# Patient Record
Sex: Male | Born: 1985 | Race: White | Hispanic: No | Marital: Married | Smoking: Former smoker
Health system: Southern US, Community
[De-identification: ages and names within clinical notes are randomized; demographics above are authoritative.]

## PROBLEM LIST (undated history)

## (undated) DIAGNOSIS — R7989 Other specified abnormal findings of blood chemistry: Secondary | ICD-10-CM

## (undated) DIAGNOSIS — F32A Depression, unspecified: Secondary | ICD-10-CM

## (undated) DIAGNOSIS — F419 Anxiety disorder, unspecified: Secondary | ICD-10-CM

## (undated) DIAGNOSIS — F329 Major depressive disorder, single episode, unspecified: Secondary | ICD-10-CM

## (undated) DIAGNOSIS — I10 Essential (primary) hypertension: Secondary | ICD-10-CM

## (undated) HISTORY — DX: Essential (primary) hypertension: I10

## (undated) HISTORY — DX: Depression, unspecified: F32.A

## (undated) HISTORY — DX: Anxiety disorder, unspecified: F41.9

## (undated) HISTORY — DX: Other specified abnormal findings of blood chemistry: R79.89

## (undated) HISTORY — DX: Major depressive disorder, single episode, unspecified: F32.9

---

## 2006-03-17 ENCOUNTER — Emergency Department: Payer: Self-pay | Admitting: Emergency Medicine

## 2006-12-19 ENCOUNTER — Other Ambulatory Visit: Payer: Self-pay

## 2006-12-19 ENCOUNTER — Emergency Department: Payer: Self-pay | Admitting: Unknown Physician Specialty

## 2007-03-12 ENCOUNTER — Emergency Department: Payer: Self-pay | Admitting: Emergency Medicine

## 2009-05-01 ENCOUNTER — Emergency Department: Payer: Self-pay

## 2009-11-15 ENCOUNTER — Encounter: Admission: RE | Admit: 2009-11-15 | Discharge: 2009-11-15 | Payer: Self-pay | Admitting: Orthopedic Surgery

## 2010-06-08 HISTORY — PX: FOOT SURGERY: SHX648

## 2011-10-02 ENCOUNTER — Other Ambulatory Visit: Payer: Self-pay | Admitting: Physician Assistant

## 2012-06-05 ENCOUNTER — Other Ambulatory Visit: Payer: Self-pay | Admitting: Physician Assistant

## 2012-06-05 NOTE — Telephone Encounter (Signed)
Please pull chart.

## 2012-06-06 NOTE — Telephone Encounter (Signed)
Chart pulled to [place] [facility] [id number]

## 2012-08-02 ENCOUNTER — Telehealth: Payer: Self-pay

## 2012-08-02 NOTE — Telephone Encounter (Signed)
Pt is out of effexor and pharmacy has sent over request please call patient at [phone]

## 2012-08-02 NOTE — Telephone Encounter (Signed)
Patient needs office visit. Called him to advise.

## 2012-08-03 ENCOUNTER — Ambulatory Visit (INDEPENDENT_AMBULATORY_CARE_PROVIDER_SITE_OTHER): Payer: 59 | Admitting: Family Medicine

## 2012-08-03 VITALS — BP 108/75 | HR 77 | Temp 98.0°F | Resp 18 | Ht 73.0 in | Wt 223.8 lb

## 2012-08-03 DIAGNOSIS — F3162 Bipolar disorder, current episode mixed, moderate: Secondary | ICD-10-CM | POA: Insufficient documentation

## 2012-08-03 DIAGNOSIS — F319 Bipolar disorder, unspecified: Secondary | ICD-10-CM

## 2012-08-03 MED ORDER — VENLAFAXINE HCL ER 75 MG PO CP24: ORAL_CAPSULE | ORAL | Status: DC

## 2012-08-03 NOTE — Patient Instructions (Addendum)
Congratulations on your new daughter!  I hope that you get along with the doctor you are meeting.   Keep an eye on your symptoms and mood- if you are not able to sleep or feel that you are becoming less stable please let us or your psychiatrist know.

## 2012-08-03 NOTE — Progress Notes (Signed)
[facility] [address] [place] [address] [phone]  Date:  08/03/2012   Name:  Breyden Jeudy   DOB:  22-Jan-1986   MRN:  [id number]  PCP:  No primary provider on file.    Chief Complaint: Follow-up   History of Present Illness:  James Haynes is a 27 y.o. very pleasant male patient who presents with the following:  Last seen here in 01/2011.   History of bipolar disorder type 1. He has been on effexor for a long time- about 8 years. He was last hospitalized at age 49, and feels that he is doing ok at this time.  He has an appt to see a new psychiatrist in [place] next week- he has had a hard time finding a psychiatrist he gets along with.   He feels that his mood is ok and stable, and denies any SI.  He had a daughter born about 6 weeks ago which is making it harder to get enough sleep, but he feels he is handling this ok.     There is no problem list on file for this patient.   Past Medical History  Diagnosis Date  . Depression     History reviewed. No pertinent past surgical history.  History  Substance Use Topics  . Smoking status: Current Some Day Smoker  . Smokeless tobacco: Not on file  . Alcohol Use: Not on file    No family history on file.  No Known Allergies  Medication list has been reviewed and updated.  Current Outpatient Prescriptions on File Prior to Visit  Medication Sig Dispense Refill  . venlafaxine XR (EFFEXOR-XR) 75 MG 24 hr capsule TAKE 3 CAPSULES DAILY  270 capsule  2   No current facility-administered medications on file prior to visit.    Review of Systems:  As per HPI- otherwise negative.   Physical Examination: Filed Vitals:   08/03/12 1512  BP: 108/75  Pulse: 77  Temp: 98 F (36.7 C)  Resp: 18   Filed Vitals:   08/03/12 1512  Height: 6' 1"  (1.854 m)  Weight: 223 lb 12.8 oz (101.515 kg)   Body mass index is 29.53 kg/(m^2). Ideal Body Weight: Weight in (lb) to have BMI = 25: 189.1  GEN:  WDWN, NAD, Non-toxic, A & O x 3, overweight HEENT: Atraumatic, Normocephalic. Neck supple. No masses, No LAD. Ears and Nose: No external deformity. CV: RRR, No M/G/R. No JVD. No thrill. No extra heart sounds. PULM: CTA B, no wheezes, crackles, rhonchi. No retractions. No resp. distress. No accessory muscle use. EXTR: No c/c/e NEURO Normal gait.  PSYCH: Normally interactive. Conversant. Not depressed or anxious appearing.  Calm demeanor.    Assessment and Plan: Bipolar disorder, unspecified - Plan: venlafaxine XR (EFFEXOR-XR) 75 MG 24 hr capsule, venlafaxine XR (EFFEXOR-XR) 75 MG 24 hr capsule  Refilled his effexor- a one week supply at local pharm and a mail away rx.  He plans to see a psychiatrist soon and explained that they will likely take over prescribing his medication. Be alert to changes in mood and be sure to get adequate rest.  Seek help as needed  Meds ordered this encounter  Medications  . venlafaxine XR (EFFEXOR-XR) 75 MG 24 hr capsule    Sig: TAKE 3 CAPSULES DAILY    Dispense:  270 capsule    Refill:  3  . venlafaxine XR (EFFEXOR-XR) 75 MG 24 hr capsule    Sig: TAKE 3 CAPSULES DAILY  Dispense:  21 capsule    Refill:  0     Wylder Macomber, MD

## 2012-11-12 ENCOUNTER — Other Ambulatory Visit: Payer: Self-pay | Admitting: Family Medicine

## 2012-11-12 NOTE — Telephone Encounter (Signed)
Pt is checking on the status of his refill request

## 2012-11-14 ENCOUNTER — Telehealth: Payer: Self-pay | Admitting: Radiology

## 2012-11-14 DIAGNOSIS — F319 Bipolar disorder, unspecified: Secondary | ICD-10-CM

## 2012-11-14 MED ORDER — VENLAFAXINE HCL ER 75 MG PO CP24: ORAL_CAPSULE | ORAL | Status: DC

## 2012-11-14 NOTE — Telephone Encounter (Signed)
LMOM to CB. On 08/03/12 we sent in #270 w/3 RFs to [facility]. Does pt want this Rx from local pharm now?

## 2012-11-14 NOTE — Telephone Encounter (Signed)
Pamala Hurry, patient did call back, one month supply of his med was sent in for him until he can get the mail order. He is out.

## 2013-08-04 ENCOUNTER — Other Ambulatory Visit: Payer: Self-pay | Admitting: Family Medicine

## 2013-11-21 ENCOUNTER — Other Ambulatory Visit: Payer: Self-pay | Admitting: Physician Assistant

## 2013-12-27 ENCOUNTER — Other Ambulatory Visit: Payer: Self-pay | Admitting: Family Medicine

## 2013-12-27 ENCOUNTER — Telehealth: Payer: Self-pay

## 2013-12-27 NOTE — Telephone Encounter (Signed)
LMOM for pt that he will need to be seen before any more RFs can be sent. It has been almost 1 1/2 yrs since last OV. Advised we are open 7 days a week and will be happy to see him.

## 2013-12-27 NOTE — Telephone Encounter (Signed)
Pt called in requesting previous message medication to be sent in

## 2013-12-27 NOTE — Telephone Encounter (Signed)
Patient is requesting we send a three month supply of venlafaxine XR (EFFEXOR-XR) 75 MG 24 hr capsule [facility] [place]

## 2013-12-29 ENCOUNTER — Ambulatory Visit (INDEPENDENT_AMBULATORY_CARE_PROVIDER_SITE_OTHER): Payer: 59 | Admitting: Family Medicine

## 2013-12-29 VITALS — BP 128/74 | HR 78 | Temp 97.7°F | Resp 16 | Ht 73.0 in | Wt 215.6 lb

## 2013-12-29 DIAGNOSIS — Z23 Encounter for immunization: Secondary | ICD-10-CM

## 2013-12-29 DIAGNOSIS — F319 Bipolar disorder, unspecified: Secondary | ICD-10-CM

## 2013-12-29 MED ORDER — VENLAFAXINE HCL ER 75 MG PO CP24: ORAL_CAPSULE | ORAL | Status: DC

## 2013-12-29 NOTE — Patient Instructions (Signed)
We got the "tdap" shot today; this protects you against tetanus, diptheria and pertussis (whooping cough).  Continue to take your effexor and try to get enough sleep.   I would recommend that you see a counselor, and also consider establishing with a psychiatrist.  It is a good idea to have a psychiatrist to help you if you ever get become acutely sick.  One good psychiatrist in [place] is [facility],  [place], [place] [phone]  There are many counselors to choose from: I do not have a personal recommendation in [place] though.    Take care!

## 2013-12-29 NOTE — Progress Notes (Signed)
[facility] [address] [place]  [address] [phone]  Date:  12/29/2013   Name:  James Haynes   DOB:  August 02, 1985   MRN:  [id number]  PCP:  No PCP Per Patient    Chief Complaint: Medication Refill and Immunizations   History of Present Illness:  James Haynes is a 28 y.o. very pleasant male patient who presents with the following:  History of bipolar disorder- seen by myself once over a year ago.  Per my note he had planned to get in with a new psychiatrist, but so far had not done so.  He denies any SI, feels that his mood is stable and is not currently suffering from depression or sx of mania.  He is continuing to take effexor.  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     Patient Active Problem List   Diagnosis Date Noted  . Bipolar 1 disorder, mixed, moderate 08/03/2012    Past Medical History  Diagnosis Date  . Depression     History reviewed. No pertinent past surgical history.  History  Substance Use Topics  . Smoking status: Current Some Day Smoker  . Smokeless tobacco: Not on file  . Alcohol Use: Not on file    History reviewed. No pertinent family history.  No Known Allergies  Medication list has been reviewed and updated.  Current Outpatient Prescriptions on File Prior to Visit  Medication Sig Dispense Refill  . venlafaxine XR (EFFEXOR-XR) 75 MG 24 hr capsule TAKE 3 CAPSULES DAILY  21 capsule  0  . venlafaxine XR (EFFEXOR-XR) 75 MG 24 hr capsule TAKE 3 CAPSULES DAILY  90 capsule  0  . venlafaxine XR (EFFEXOR-XR) 75 MG 24 hr capsule TAKE 3 CAPSULES DAILY  270 capsule  0   No current facility-administered medications on file prior to visit.    Review of Systems:  As per HPI- otherwise negative.   Physical Examination: Filed Vitals:   12/29/13 1011  BP: 128/74  Pulse: 78  Temp: 97.7 F (36.5 C)  Resp: 16   Filed Vitals:   12/29/13 1011  Height:  6' 1"  (1.854 m)  Weight: 215 lb 9.6 oz (97.796 kg)   Body mass index is 28.45 kg/(m^2). Ideal Body Weight: Weight in (lb) to have BMI = 25: 189.1  GEN: WDWN, NAD, Non-toxic, A & O x 3, looks well HEENT: Atraumatic, Normocephalic. Neck supple. No masses, No LAD. Ears and Nose: No external deformity. CV: RRR, No M/G/R. No JVD. No thrill. No extra heart sounds. PULM: CTA B, no wheezes, crackles, rhonchi. No retractions. No resp. distress. No accessory muscle use. EXTR: No c/c/e NEURO Normal gait.  PSYCH: Normally interactive. Conversant. Not depressed or anxious appearing.  Calm demeanor.    Assessment and Plan: Immunization due - Plan: Tdap vaccine greater than or equal to 7yo IM  Bipolar disorder, unspecified - Plan: venlafaxine XR (EFFEXOR-XR) 75 MG 24 hr capsule, DISCONTINUED: venlafaxine XR (EFFEXOR-XR) 75 MG 24 hr capsule  Updated tdap and refilled his effexor today.  Again recommended that he establish with psychiatry- See patient instructions for more details.     Signed Lamar Blinks, MD

## 2014-03-30 ENCOUNTER — Other Ambulatory Visit: Payer: Self-pay | Admitting: Family Medicine

## 2014-04-30 ENCOUNTER — Other Ambulatory Visit: Payer: Self-pay | Admitting: Physician Assistant

## 2014-05-16 ENCOUNTER — Ambulatory Visit (INDEPENDENT_AMBULATORY_CARE_PROVIDER_SITE_OTHER): Payer: 59 | Admitting: Family Medicine

## 2014-05-16 VITALS — BP 118/78 | HR 82 | Temp 98.4°F | Resp 16 | Ht 74.0 in | Wt 230.0 lb

## 2014-05-16 DIAGNOSIS — Z23 Encounter for immunization: Secondary | ICD-10-CM

## 2014-05-16 DIAGNOSIS — F3162 Bipolar disorder, current episode mixed, moderate: Secondary | ICD-10-CM

## 2014-05-16 MED ORDER — VENLAFAXINE HCL ER 75 MG PO CP24: ORAL_CAPSULE | ORAL | Status: DC

## 2014-05-16 NOTE — Patient Instructions (Addendum)
Please come and see me in about 6 months to check your progress- sooner if you have any problems!  Please do work on quitting smoking and dipping   Influenza Vaccine (Flu Vaccine, Inactivated or Recombinant) 2014-2015: What You Need to Know 1. Why get vaccinated? Influenza ("flu") is a contagious disease that spreads around the [place] every winter, usually between October and May. Flu is caused by influenza viruses, and is spread mainly by coughing, sneezing, and close contact. Anyone can get flu, but the risk of getting flu is highest among children. Symptoms come on suddenly and may last several days. They can include:  fever/chills  sore throat  muscle aches  fatigue  cough  headache  runny or stuffy nose Flu can make some people much sicker than others. These people include young children, people 28 and older, pregnant women, and people with certain health conditions-such as heart, lung or kidney disease, nervous system disorders, or a weakened immune system. Flu vaccination is especially important for these people, and anyone in close contact with them. Flu can also lead to pneumonia, and make existing medical conditions worse. It can cause diarrhea and seizures in children. Each year thousands of people in the [place] die from flu, and many more are hospitalized. Flu vaccine is the best protection against flu and its complications. Flu vaccine also helps prevent spreading flu from person to person. 2. Inactivated and recombinant flu vaccines You are getting an injectable flu vaccine, which is either an "inactivated" or "recombinant" vaccine. These vaccines do not contain any live influenza virus. They are given by injection with a needle, and often called the "flu shot."  A different live, attenuated (weakened) influenza vaccine is sprayed into the nostrils. This vaccine is described in a separate Vaccine Information Statement. Flu vaccination is recommended every  year. Some children 6 months through 54 years of age might need two doses during one year. Flu viruses are always changing. Each year's flu vaccine is made to protect against 3 or 4 viruses that are likely to cause disease that year. Flu vaccine cannot prevent all cases of flu, but it is the best defense against the disease.  It takes about 2 weeks for protection to develop after the vaccination, and protection lasts several months to a year. Some illnesses that are not caused by influenza virus are often mistaken for flu. Flu vaccine will not prevent these illnesses. It can only prevent influenza. Some inactivated flu vaccine contains a very small amount of a mercury-based preservative called thimerosal. Studies have shown that thimerosal in vaccines is not harmful, but flu vaccines that do not contain a preservative are available. 3. Some people should not get this vaccine Tell the person who gives you the vaccine:  If you have any severe, life-threatening allergies. If you ever had a life-threatening allergic reaction after a dose of flu vaccine, or have a severe allergy to any part of this vaccine, including (for example) an allergy to gelatin, antibiotics, or eggs, you may be advised not to get vaccinated. Most, but not all, types of flu vaccine contain a small amount of egg protein.  If you ever had Guillain-Barr Syndrome (a severe paralyzing illness, also called GBS). Some people with a history of GBS should not get this vaccine. This should be discussed with your doctor.  If you are not feeling well. It is usually okay to get flu vaccine when you have a mild illness, but you might be advised to wait  until you feel better. You should come back when you are better. 4. Risks of a vaccine reaction With a vaccine, like any medicine, there is a chance of side effects. These are usually mild and go away on their own. Problems that could happen after any vaccine:  Brief fainting spells can happen  after any medical procedure, including vaccination. Sitting or lying down for about 15 minutes can help prevent fainting, and injuries caused by a fall. Tell your doctor if you feel dizzy, or have vision changes or ringing in the ears.  Severe shoulder pain and reduced range of motion in the arm where a shot was given can happen, very rarely, after a vaccination.  Severe allergic reactions from a vaccine are very rare, estimated at less than 1 in a million doses. If one were to occur, it would usually be within a few minutes to a few hours after the vaccination. Mild problems following inactivated flu vaccine:  soreness, redness, or swelling where the shot was given  hoarseness  sore, red or itchy eyes  cough  fever  aches  headache  itching  fatigue If these problems occur, they usually begin soon after the shot and last 1 or 2 days. Moderate problems following inactivated flu vaccine:  Young children who get inactivated flu vaccine and pneumococcal vaccine (PCV13) at the same time may be at increased risk for seizures caused by fever. Ask your doctor for more information. Tell your doctor if a child who is getting flu vaccine has ever had a seizure. Inactivated flu vaccine does not contain live flu virus, so you cannot get the flu from this vaccine. As with any medicine, there is a very remote chance of a vaccine causing a serious injury or death. The safety of vaccines is always being monitored. For more information, visit: http://www.aguilar.org/ 5. What if there is a serious reaction? What should I look for?  Look for anything that concerns you, such as signs of a severe allergic reaction, very high fever, or behavior changes. Signs of a severe allergic reaction can include hives, swelling of the face and throat, difficulty breathing, a fast heartbeat, dizziness, and weakness. These would start a few minutes to a few hours after the vaccination. What should I do?  If you  think it is a severe allergic reaction or other emergency that can't wait, call 9-1-1 and get the person to the nearest hospital. Otherwise, call your doctor.  Afterward, the reaction should be reported to the Vaccine Adverse Event Reporting System (VAERS). Your doctor should file this report, or you can do it yourself through the VAERS web site at www.vaers.SamedayNews.es, or by calling 670-758-2421. VAERS does not give medical advice. 6. The National Vaccine Injury Compensation Program The [employer] Vaccine Injury Compensation Program (VICP) is a federal program that was created to compensate people who may have been injured by certain vaccines. Persons who believe they may have been injured by a vaccine can learn about the program and about filing a claim by calling 443-134-5511 or visiting the [place] website at GoldCloset.com.ee. There is a time limit to file a claim for compensation. 7. How can I learn more?  Ask your health care provider.  Call your local or state health department.  Contact the Centers for Disease Control and Prevention (CDC):  Call 223 356 8580 (1-800-CDC-INFO) or  Visit CDC's website at https://gibson.com/ CDC Vaccine Information Statement (Interim) Inactivated Influenza Vaccine (01/24/2013) Document Released: 03/19/2006 Document Revised: 10/09/2013 Document Reviewed: 05/12/2013 ExitCare Patient Information  2015 Exit[employer]. This information is not intended to replace advice given to you by your health care provider. Make sure you discuss any questions you have with your health care provider.

## 2014-05-16 NOTE — Progress Notes (Signed)
[facility] [address], [place] [address] [phone]  Date:  05/16/2014   Name:  James Haynes   DOB:  1985-09-20   MRN:  [id number]  PCP:  No PCP Per Patient    Chief Complaint: Medication Refill; Venlafaxine; and Flu Vaccine   History of Present Illness:  James Haynes is a 28 y.o. very pleasant male patient who presents with the following:  He is here today to recheck his medication/ effexor refill He feels like he is doing ok with current dose of effexor.  He is not having any depressive or manic sx. Sleeping ok although they do have a 28 year old who limits his ability to sleep.  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  He feels this his mood is stable and he is doing overall ok.  No isuses with any foolish actions or spending money excessively.    He has been on effexor for 10 years or so and has done well for a long time.    BP Readings from Last 3 Encounters:  05/16/14 118/78  12/29/13 128/74  08/03/12 108/75     Patient Active Problem List   Diagnosis Date Noted  . Bipolar 1 disorder, mixed, moderate 08/03/2012    Past Medical History  Diagnosis Date  . Depression     History reviewed. No pertinent past surgical history.  History  Substance Use Topics  . Smoking status: Current Some Day Smoker  . Smokeless tobacco: Not on file  . Alcohol Use: Not on file    History reviewed. No pertinent family history.  No Known Allergies  Medication list has been reviewed and updated.  Current Outpatient Prescriptions on File Prior to Visit  Medication Sig Dispense Refill  . venlafaxine XR (EFFEXOR-XR) 75 MG 24 hr capsule TAKE 3 CAPSULES DAILY.   2ND NEEDS OFFICE VISIT FOR ADDITIONAL VISITS" 30 capsule 0   No current facility-administered medications on file prior to visit.    Review of Systems:  As per HPI- otherwise negative.   Physical Examination: Filed Vitals:   05/16/14 1322  BP: 118/78  Pulse: 82  Temp: 98.4 F (36.9 C)  Resp: 16   Filed Vitals:   05/16/14 1322  Height: 6' 2"  (1.88 m)  Weight: 230 lb (104.327 kg)   Body mass index is 29.52 kg/(m^2). Ideal Body Weight: Weight in (lb) to have BMI = 25: 194.3  GEN: WDWN, NAD, Non-toxic, A & O x 3 HEENT: Atraumatic, Normocephalic. Neck supple. No masses, No LAD. Ears and Nose: No external deformity. CV: RRR, No M/G/R. No JVD. No thrill. No extra heart sounds. PULM: CTA B, no wheezes, crackles, rhonchi. No retractions. No resp. distress. No accessory muscle use. EXTR: No c/c/e NEURO Normal gait.  PSYCH: Normally interactive. Conversant. Not depressed or anxious appearing.  Calm demeanor.    Assessment and Plan: Bipolar 1 disorder, mixed, moderate - Plan: venlafaxine XR (EFFEXOR-XR) 75 MG 24 hr capsule  Needs flu shot - Plan: Flu Vaccine QUAD 36+ mos IM  Flu shot today.   Refilled his effexor.  We have been taking care of his bipolar as it is difficult for him to see a psychitrist.  He has been stable with his medication for some time.  Asked him to report any problems and plan to recheck in 6 months.   Signed Lamar Blinks, MD

## 2014-05-29 ENCOUNTER — Ambulatory Visit (INDEPENDENT_AMBULATORY_CARE_PROVIDER_SITE_OTHER): Payer: 59 | Admitting: Family Medicine

## 2014-05-29 VITALS — BP 130/82 | HR 97 | Temp 98.0°F | Resp 18 | Ht 74.0 in | Wt 224.2 lb

## 2014-05-29 DIAGNOSIS — H65191 Other acute nonsuppurative otitis media, right ear: Secondary | ICD-10-CM

## 2014-05-29 DIAGNOSIS — J029 Acute pharyngitis, unspecified: Secondary | ICD-10-CM

## 2014-05-29 LAB — POCT RAPID STREP A (OFFICE): RAPID STREP A SCREEN: NEGATIVE

## 2014-05-29 MED ORDER — CEFDINIR 300 MG PO CAPS: 300.0000 mg | ORAL_CAPSULE | Freq: Two times a day (BID) | ORAL | Status: DC

## 2014-05-29 NOTE — Patient Instructions (Addendum)
We are going to go ahead and treat you for possible strep throat and an ear infection.  Let me know if you do not feel better soon and I will be in touch with your throat culture Rest and use ibuprofen as needed- let me know if you do not feel better soon!

## 2014-05-29 NOTE — Progress Notes (Addendum)
[facility] [address], [place] [address] [phone]  Date:  05/29/2014   Name:  James Haynes   DOB:  [birth year]   MRN:  [id number]  PCP:  No PCP Per Patient    Chief Complaint: Sore Throat; Neck Pain; and Cough   History of Present Illness:  James Haynes is a 28 y.o. very pleasant male patient who presents with the following:  He has noted a ST for about 2-3 days.  It is getting worse and his glands are sore.  He has not noted a fever but has not checked with a thermometer His ears hurt when he moves his jaw.   He has noted a mild cough for a couple of weeks.  He has noted some nausea but no vomiting.   No known strep exposure.     Patient Active Problem List   Diagnosis Date Noted  . Bipolar 1 disorder, mixed, moderate 08/03/2012    Past Medical History  Diagnosis Date  . Depression     History reviewed. No pertinent past surgical history.  History  Substance Use Topics  . Smoking status: Current Some Day Smoker  . Smokeless tobacco: Not on file  . Alcohol Use: Not on file    History reviewed. No pertinent family history.  No Known Allergies  Medication list has been reviewed and updated.  Current Outpatient Prescriptions on File Prior to Visit  Medication Sig Dispense Refill  . venlafaxine XR (EFFEXOR-XR) 75 MG 24 hr capsule TAKE 3 CAPSULES DAILY.   2ND NEEDS OFFICE VISIT FOR ADDITIONAL VISITS" 270 capsule 1   No current facility-administered medications on file prior to visit.    Review of Systems:  As per HPI- otherwise negative. Never dx with mono in the past    Physical Examination: Filed Vitals:   05/29/14 1059  BP: 130/82  Pulse: 97  Temp: 98 F (36.7 C)  Resp: 18   Filed Vitals:   05/29/14 1059  Height: 6' 2"  (1.88 m)  Weight: 224 lb 3.2 oz (101.696 kg)   Body mass index is 28.77 kg/(m^2). Ideal Body Weight: Weight in (lb) to have BMI = 25: 194.3  GEN: WDWN, NAD, Non-toxic, A & O x 3 HEENT:  Atraumatic, Normocephalic. Neck supple. No masses, No LAD.  Right TM slightly injected, left normal , oropharynx injected without exudate.  PEERL,EOMI.  Ears and Nose: No external deformity. CV: RRR, No M/G/R. No JVD. No thrill. No extra heart sounds. PULM: CTA B, no wheezes, crackles, rhonchi. No retractions. No resp. distress. No accessory muscle use. ABD: S, NT, ND. No rebound. No HSM. EXTR: No c/c/e NEURO Normal gait.  PSYCH: Normally interactive. Conversant. Not depressed or anxious appearing.  Calm demeanor.  Difficult throat exam due to gag reflex, poor swab  Results for orders placed or performed in visit on 05/29/14  POCT rapid strep A  Result Value Ref Range   Rapid Strep A Screen Negative Negative    Assessment and Plan: Acute pharyngitis, unspecified pharyngitis type - Plan: POCT rapid strep A, Culture, Group A Strep, cefdinir (OMNICEF) 300 MG capsule  Acute nonsuppurative otitis media of right ear - Plan: cefdinir (OMNICEF) 300 MG capsule  Choose omnicef to cover OM and because we cannot rule- out mono Await culture and he will seek care if getting worse!  Signed Lamar Blinks, MD  Called 12/24:   Results for orders placed or performed in visit on 05/29/14  Culture, Group A Strep  Result Value Ref Range   Organism ID, Bacteria Normal Upper Respiratory Flora    Organism ID, Bacteria No Beta Hemolytic Streptococci Isolated   POCT rapid strep A  Result Value Ref Range   Rapid Strep A Screen Negative Negative   LMOM that culture is negative.  Let me know if not getting better

## 2014-05-31 LAB — CULTURE, GROUP A STREP: Organism ID, Bacteria: NORMAL

## 2014-06-02 ENCOUNTER — Ambulatory Visit (INDEPENDENT_AMBULATORY_CARE_PROVIDER_SITE_OTHER): Payer: 59

## 2014-06-02 ENCOUNTER — Ambulatory Visit (INDEPENDENT_AMBULATORY_CARE_PROVIDER_SITE_OTHER): Payer: 59 | Admitting: Family Medicine

## 2014-06-02 VITALS — BP 120/82 | HR 71 | Temp 97.7°F | Resp 16 | Ht 74.25 in | Wt 221.4 lb

## 2014-06-02 DIAGNOSIS — H9203 Otalgia, bilateral: Secondary | ICD-10-CM

## 2014-06-02 DIAGNOSIS — R0989 Other specified symptoms and signs involving the circulatory and respiratory systems: Secondary | ICD-10-CM

## 2014-06-02 DIAGNOSIS — J029 Acute pharyngitis, unspecified: Secondary | ICD-10-CM

## 2014-06-02 MED ORDER — AZITHROMYCIN 250 MG PO TABS: ORAL_TABLET | ORAL | Status: DC

## 2014-06-02 NOTE — Patient Instructions (Signed)
Your chest xray is most likely normal but may show an infection that the radiologist may confirm later today.  Your ears are both still red but your throat looks ok.  We'll stop the omnicef and treat with azithromycin. Take 2 pills the 1st day then one a day for the next four days.  Be sure to get plenty of rest, drink fluids, and take ibuprofen for the sore throat and ears.  If you're not feeling better in 3-4 days please return to clinic.

## 2014-06-02 NOTE — Progress Notes (Signed)
Patient was discussed with Araceli Bouche, PA-C. X-ray was examined. There looks like there may be a little prominence of markings, but certainly nothing that can call. Is a little right middle lobe prominence of the fissure on the lateral view. Radiologist this. Agree with retreatment plan.

## 2014-06-02 NOTE — Progress Notes (Signed)
Subjective:    Patient ID: James Haynes, male    DOB: 27-Dec-1985, 28 y.o.   MRN: [id number]  PCP: No PCP Per Patient  Chief Complaint  Patient presents with  . Sore Throat    pt states he was here on Tuesday with same issue and has not gotten any better  . Otalgia    right ear   Patient Active Problem List   Diagnosis Date Noted  . Bipolar 1 disorder, mixed, moderate 08/03/2012   Prior to Admission medications   Medication Sig Start Date End Date Taking? Authorizing Provider  cefdinir (OMNICEF) 300 MG capsule Take 1 capsule (300 mg total) by mouth 2 (two) times daily. 05/29/14  Yes Gay Filler Copland, MD  venlafaxine XR (EFFEXOR-XR) 75 MG 24 hr capsule TAKE 3 CAPSULES DAILY.   2ND NEEDS OFFICE VISIT FOR ADDITIONAL VISITS" 05/16/14  Yes Darreld Mclean, MD   Medications, allergies, past medical history, surgical history, family history, social history and problem list reviewed and updated.  HPI  1 yom with phm bipolar depression returns to clinic with continued sore throat and otalgia.  Was seen in clinic 12/22. Slight erythema right TM and pharyngeal erythema. Cefidinir 300 mg bid prescribed. Streb swab neg, cx sent.   Today he presents stating his sx never improved since he was seen here 4 days ago. He continues to have sore throat and is having pain with swallowing. His right ear continues to hurt and now his left ear is somewhat painful. Has not been having any fever, chills.   Has been having diarrhea past couple days, non bloody. Slight nausea and vomiting 2 days ago. Has had mild non prod cough past 4 days. Mild neck pain past four days unchanged at this time. Painful only with certain movements including flexion and lateral movements.   Med review shows he was prescribed azithro 2 months ago. Pt thinks this was for a cold but unsure where he went for this care.   Review of Systems No CP, SOB, fever, chills. No dysuria, abd pain.     Objective:   Physical Exam    Constitutional: He is oriented to person, place, and time. He appears well-developed and well-nourished.  Non-toxic appearance. He does not have a sickly appearance. He appears ill. No distress.  BP 120/82 mmHg  Pulse 71  Temp(Src) 97.7 F (36.5 C) (Oral)  Resp 16  Ht 6' 2.25" (1.886 m)  Wt 221 lb 6.4 oz (100.426 kg)  BMI 28.23 kg/m2  SpO2 99%   HENT:  Right Ear: Ear canal normal. Tympanic membrane is injected. No middle ear effusion.  Left Ear: Ear canal normal. Tympanic membrane is injected.  No middle ear effusion.  Mouth/Throat: Uvula is midline, oropharynx is clear and moist and mucous membranes are normal. No oropharyngeal exudate, posterior oropharyngeal edema, posterior oropharyngeal erythema or tonsillar abscesses.  Mild injection TMs bilaterally. No effusion.   Neck: Normal range of motion. Normal range of motion present. No Brudzinski's sign noted.    Slight tenderness with neck flexion actually over posterior skull, not neck. Same discomfort with neck lateral rotation.   Cardiovascular: Normal rate, regular rhythm and normal heart sounds.  Exam reveals no gallop.   No murmur heard. Pulmonary/Chest: Effort normal. No accessory muscle usage. No respiratory distress. He has no decreased breath sounds. He has wheezes in the right middle field and the right lower field. He has rhonchi in the right middle field and the right lower field. He has  no rales.  Lymphadenopathy:       Head (right side): No submental, no submandibular and no tonsillar adenopathy present.       Head (left side): No submental, no submandibular and no tonsillar adenopathy present.    He has no cervical adenopathy.  Neurological: He is alert and oriented to person, place, and time.  Psychiatric: He has a normal mood and affect. His speech is normal.   [facility] reading (PRIMARY) by  Dr. Linna Darner. Findings: Possible congestion/scarring RML fissure on lateral view. Prominent brochopulmonary markings probably  normal.      Assessment & Plan:   33 yom with phm bipolar depression returns to clinic with continued sore throat and otalgia.  Abnormal lung sounds - Plan: DG Chest 2 View, azithromycin (ZITHROMAX) 250 MG tablet Otalgia of both ears - Plan: azithromycin (ZITHROMAX) 250 MG tablet Sore throat --cxr with possible congestion/scarring RML fissure, await radiology read --otalgia/sore throat not improved on cefdinir, right TM still injected, left now injected as well --throat cx came back neg 2 days ago --switch to azithro due to possible pulmonary component --rest/fluids/ibuprofen/lozenges --rtc if not improving 3-4 days  Julieta Gutting, PA-C Physician Assistant-Certified [facility]  06/02/2014 3:04 PM

## 2014-07-13 ENCOUNTER — Other Ambulatory Visit: Payer: Self-pay | Admitting: Family Medicine

## 2014-08-17 ENCOUNTER — Ambulatory Visit (INDEPENDENT_AMBULATORY_CARE_PROVIDER_SITE_OTHER): Payer: 59 | Admitting: Family Medicine

## 2014-08-17 VITALS — BP 132/90 | HR 79 | Temp 97.6°F | Resp 16 | Ht 74.5 in | Wt 224.0 lb

## 2014-08-17 DIAGNOSIS — F3162 Bipolar disorder, current episode mixed, moderate: Secondary | ICD-10-CM

## 2014-08-17 MED ORDER — VENLAFAXINE HCL ER 75 MG PO CP24: ORAL_CAPSULE | ORAL | Status: DC

## 2014-08-17 NOTE — Patient Instructions (Signed)
Please come and see me in about 6 months Please let me know if you have any changes in your mood or any other symptoms that concern you  Please work on quitting smoking! As you know, I do encourage you to see a psychiatrist if you are able; if you ever need a recommendation let me know

## 2014-08-17 NOTE — Progress Notes (Signed)
[facility] [address], [place] [address] [phone]  Date:  08/17/2014   Name:  James Haynes   DOB:  [birth year]   MRN:  [id number]  PCP:  No PCP Per Patient    Chief Complaint: Medication Refill   History of Present Illness:  James Haynes is a 29 y.o. very pleasant male patient who presents with the following:  Here today to recheck his bipolar disorder.  He has been on effexor for about 10 years and has done well. His family his doing well- his daughter is 66 years old.  He feels that he is doing well with his medicaiton.  He is sleeping okay, he has not noted any depression sx and has not noted any sx of mania.  He has not noted any racing thoughts, no unusual behaviors  he has had his flu shot He is otherwise generally in good health  BP Readings from Last 3 Encounters:  08/17/14 140/90  06/02/14 120/82  05/29/14 130/82     Patient Active Problem List   Diagnosis Date Noted  . Bipolar 1 disorder, mixed, moderate 08/03/2012    Past Medical History  Diagnosis Date  . Depression     History reviewed. No pertinent past surgical history.  History  Substance Use Topics  . Smoking status: Current Some Day Smoker  . Smokeless tobacco: Not on file  . Alcohol Use: 1.2 oz/week    2 Cans of beer per week    History reviewed. No pertinent family history.  No Known Allergies  Medication list has been reviewed and updated.  Current Outpatient Prescriptions on File Prior to Visit  Medication Sig Dispense Refill  . venlafaxine XR (EFFEXOR-XR) 75 MG 24 hr capsule TAKE 3 CAPSULES BY MOUTH EVERY DAY- "OV VISIT NEEDED FOR ADDITIONAL REFILLS" 90 capsule 0   No current facility-administered medications on file prior to visit.    Review of Systems:  As per HPI- otherwise negative.   Physical Examination: Filed Vitals:   08/17/14 1250  BP: 140/90  Pulse: 79  Temp: 97.6 F (36.4 C)  Resp: 16   Filed Vitals:   08/17/14 1250  Height:  6' 2.5" (1.892 m)  Weight: 224 lb (101.606 kg)   Body mass index is 28.38 kg/(m^2). Ideal Body Weight: Weight in (lb) to have BMI = 25: 196.9  GEN: WDWN, NAD, Non-toxic, A & O x 3, looks well HEENT: Atraumatic, Normocephalic. Neck supple. No masses, No LAD. Ears and Nose: No external deformity. CV: RRR, No M/G/R. No JVD. No thrill. No extra heart sounds. PULM: CTA B, no wheezes, crackles, rhonchi. No retractions. No resp. distress. No accessory muscle use. EXTR: No c/c/e NEURO Normal gait.  PSYCH: Normally interactive. Conversant. Not depressed or anxious appearing.  Calm demeanor.    Assessment and Plan: Bipolar 1 disorder, mixed, moderate - Plan: venlafaxine XR (EFFEXOR-XR) 75 MG 24 hr capsule  His condition is stable.  So far he has not been willing/able to see psychiatry.  Will refill his medication for him, encouraged smoking cessation, follow-up if 49month   Signed JLamar Blinks MD

## 2014-11-13 ENCOUNTER — Other Ambulatory Visit: Payer: Self-pay | Admitting: Physician Assistant

## 2014-11-13 ENCOUNTER — Ambulatory Visit (INDEPENDENT_AMBULATORY_CARE_PROVIDER_SITE_OTHER): Payer: 59 | Admitting: Internal Medicine

## 2014-11-13 VITALS — BP 130/90 | HR 83 | Temp 98.5°F | Resp 18 | Ht 74.5 in | Wt 218.2 lb

## 2014-11-13 DIAGNOSIS — N50811 Right testicular pain: Secondary | ICD-10-CM

## 2014-11-13 DIAGNOSIS — N508 Other specified disorders of male genital organs: Secondary | ICD-10-CM

## 2014-11-13 LAB — POCT CBC
Granulocyte percent: 72.7 %G (ref 37–80)
HCT, POC: 44.1 % (ref 43.5–53.7)
HEMOGLOBIN: 14.8 g/dL (ref 14.1–18.1)
LYMPH, POC: 2 (ref 0.6–3.4)
MCH: 29 pg (ref 27–31.2)
MCHC: 33.5 g/dL (ref 31.8–35.4)
MCV: 86.6 fL (ref 80–97)
MID (CBC): 0.9 (ref 0–0.9)
MPV: 6.6 fL (ref 0–99.8)
PLATELET COUNT, POC: 474 10*3/uL — AB (ref 142–424)
POC GRANULOCYTE: 7.7 — AB (ref 2–6.9)
POC LYMPH PERCENT: 19 %L (ref 10–50)
POC MID %: 8.3 %M (ref 0–12)
RBC: 5.09 M/uL (ref 4.69–6.13)
RDW, POC: 12.1 %
WBC: 10.6 10*3/uL — AB (ref 4.6–10.2)

## 2014-11-13 LAB — POCT URINALYSIS DIPSTICK
BILIRUBIN UA: NEGATIVE
GLUCOSE UA: NEGATIVE
KETONES UA: NEGATIVE
LEUKOCYTES UA: NEGATIVE
NITRITE UA: NEGATIVE
PROTEIN UA: NEGATIVE
RBC UA: NEGATIVE
Spec Grav, UA: 1.02
UROBILINOGEN UA: 0.2
pH, UA: 5.5

## 2014-11-13 LAB — POCT UA - MICROSCOPIC ONLY
Bacteria, U Microscopic: NEGATIVE
CASTS, UR, LPF, POC: NEGATIVE
Crystals, Ur, HPF, POC: NEGATIVE
EPITHELIAL CELLS, URINE PER MICROSCOPY: NEGATIVE
MUCUS UA: POSITIVE
YEAST UA: NEGATIVE

## 2014-11-13 MED ORDER — CEFTRIAXONE SODIUM 1 G IJ SOLR
250.0000 mg | Freq: Once | INTRAMUSCULAR | Status: AC
Start: 2014-11-13 — End: 2014-11-13
  Administered 2014-11-13: 250 mg via INTRAMUSCULAR

## 2014-11-13 MED ORDER — DOXYCYCLINE HYCLATE 100 MG PO CAPS: 100.0000 mg | ORAL_CAPSULE | Freq: Two times a day (BID) | ORAL | Status: AC
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
## 2014-11-13 NOTE — Patient Instructions (Signed)
Please take the doxycycline as prescribed. You should await phone call for your [place] appointment. I will have your lab results within the next 10 days, if not sooner. Please use ibuprofen/ or tylenol for your pain, and to reduce inflammation.  You can take ibuprofen 422m every 6 hours, no more than 24033mper day for now.  Epididymitis Epididymitis is a swelling (inflammation) of the epididymis. The epididymis is a cord-like structure along the back part of the testicle. Epididymitis is usually, but not always, caused by infection. This is usually a sudden problem beginning with chills, fever and pain behind the scrotum and in the testicle. There may be swelling and redness of the testicle. DIAGNOSIS  Physical examination will reveal a tender, swollen epididymis. Sometimes, cultures are obtained from the urine or from prostate secretions to help find out if there is an infection or if the cause is a different problem. Sometimes, blood work is performed to see if your white blood cell count is elevated and if a germ (bacterial) or viral infection is present. Using this knowledge, an appropriate medicine which kills germs (antibiotic) can be chosen by your caregiver. A viral infection causing epididymitis will most often go away (resolve) without treatment. HOME CARE INSTRUCTIONS   Hot sitz baths for 20 minutes, 4 times per day, may help relieve pain.  Only take over-the-counter or prescription medicines for pain, discomfort or fever as directed by your caregiver.  Take all medicines, including antibiotics, as directed. Take the antibiotics for the full prescribed length of time even if you are feeling better.  It is very important to keep all follow-up appointments. SEEK IMMEDIATE MEDICAL CARE IF:   You have a fever.  You have pain not relieved with medicines.  You have any worsening of your problems.  Your pain seems to come and go.  You develop pain, redness, and swelling in the scrotum  and surrounding areas. MAKE SURE YOU:   Understand these instructions.  Will watch your condition.  Will get help right away if you are not doing well or get worse. Document Released: 05/22/2000 Document Revised: 08/17/2011 Document Reviewed: 04/11/2009 [facility]at[facility] Information 2015 [employer]This information is not intended to replace advice given to you by your health care provider. Make sure you discuss any questions you have with your health care provider.

## 2014-11-14 ENCOUNTER — Telehealth: Payer: Self-pay

## 2014-11-14 ENCOUNTER — Telehealth: Payer: Self-pay | Admitting: Physician Assistant

## 2014-11-14 NOTE — Telephone Encounter (Signed)
Pt was seen here by Ivar Drape on 6/7. He thought he could handle the pain, but he can not. He would like a work note for today and the rest of the week. If this can be done, he would like [place] to fax to his work-[employer]. The fax [phone].  Please advise at [phone]

## 2014-11-14 NOTE — Telephone Encounter (Signed)
Contacted patient about letter.  Faxing information for absence note per patient's request.

## 2014-11-14 NOTE — Telephone Encounter (Signed)
James Haynes, please advise.

## 2014-11-14 NOTE — Telephone Encounter (Signed)
Patient returning call please call [phone]

## 2014-11-16 ENCOUNTER — Ambulatory Visit
Admission: RE | Admit: 2014-11-16 | Discharge: 2014-11-16 | Disposition: A | Discharge status: Home / Self Care | Payer: 59 | Source: Ambulatory Visit | Attending: Physician Assistant | Admitting: Physician Assistant

## 2014-11-16 ENCOUNTER — Ambulatory Visit
Admission: RE | Admit: 2014-11-16 | Discharge: 2014-11-16 | Disposition: A | Discharge status: Home / Self Care | Payer: Self-pay | Source: Ambulatory Visit | Attending: Physician Assistant | Admitting: Physician Assistant

## 2014-11-16 DIAGNOSIS — N50811 Right testicular pain: Secondary | ICD-10-CM

## 2014-11-17 ENCOUNTER — Telehealth: Payer: Self-pay | Admitting: Physician Assistant

## 2014-11-17 DIAGNOSIS — N452 Orchitis: Secondary | ICD-10-CM

## 2014-11-17 NOTE — Telephone Encounter (Signed)
Spoke to patient of lab and [place] results.  Patient states that he is doing only slightly better.  He is on day 4 of his abx and is compliant, and taking NSAID.  He declines any pain meds at this time.  I have advised him that this is is appearing like epididymo-orchitis and he should continue the regimen.  I am advising, upon recommendation that we do another [place] following the abx treatment and he agrees.  He states that he is uptodate on his immunizations.   -GC/chl was not collected at this time.  I have reassured him that this is covered with our visit rocephin injection, and doxy bid for 10 days.  He states that his last sexual activity may be more than one year ago.  He has sole custody of 5 year old daughter.

## 2014-11-19 ENCOUNTER — Other Ambulatory Visit: Payer: Self-pay | Admitting: Physician Assistant

## 2014-11-19 DIAGNOSIS — N452 Orchitis: Secondary | ICD-10-CM

## 2014-11-27 ENCOUNTER — Telehealth: Payer: Self-pay

## 2014-11-27 ENCOUNTER — Other Ambulatory Visit: Payer: Self-pay | Admitting: Physician Assistant

## 2014-11-27 DIAGNOSIS — N452 Orchitis: Secondary | ICD-10-CM

## 2014-11-27 NOTE — Telephone Encounter (Signed)
Pt dropped of FMLA ppw on 6/18 for [employer] [place] [employer] to complete in 5-7 business days. Please return to disability department upon completion. Jasmine or myself will scan completed ppw into epic, and fax forms to [phone].   [id number]#[id number] placed on hold

## 2014-11-30 ENCOUNTER — Ambulatory Visit
Admission: RE | Admit: 2014-11-30 | Discharge: 2014-11-30 | Disposition: A | Discharge status: Home / Self Care | Payer: 59 | Source: Ambulatory Visit | Attending: Physician Assistant | Admitting: Physician Assistant

## 2014-11-30 DIAGNOSIS — N452 Orchitis: Secondary | ICD-10-CM

## 2014-12-01 ENCOUNTER — Other Ambulatory Visit: Payer: Self-pay | Admitting: Physician Assistant

## 2014-12-02 ENCOUNTER — Other Ambulatory Visit: Payer: Self-pay | Admitting: Physician Assistant

## 2014-12-02 DIAGNOSIS — N50811 Right testicular pain: Secondary | ICD-10-CM

## 2014-12-02 DIAGNOSIS — N452 Orchitis: Secondary | ICD-10-CM

## 2014-12-03 NOTE — Telephone Encounter (Signed)
ppw faxed/scanned/patient notified

## 2014-12-07 DIAGNOSIS — Z0271 Encounter for disability determination: Secondary | ICD-10-CM

## 2015-05-24 ENCOUNTER — Other Ambulatory Visit: Payer: Self-pay | Admitting: Family Medicine

## 2015-06-07 ENCOUNTER — Ambulatory Visit (INDEPENDENT_AMBULATORY_CARE_PROVIDER_SITE_OTHER): Payer: 59 | Admitting: Physician Assistant

## 2015-06-07 VITALS — BP 128/96 | HR 81 | Temp 97.7°F | Resp 16 | Ht 73.0 in | Wt 228.0 lb

## 2015-06-07 DIAGNOSIS — F3162 Bipolar disorder, current episode mixed, moderate: Secondary | ICD-10-CM | POA: Diagnosis not present

## 2015-06-07 MED ORDER — VENLAFAXINE HCL ER 75 MG PO CP24: ORAL_CAPSULE | ORAL | Status: DC

## 2015-06-07 NOTE — Progress Notes (Signed)
[facility] [address] [place] [address] [phone]  Date:  06/07/2015   Name:  James Haynes   DOB:  06-29-1985   MRN:  [id number]  PCP:  No PCP Per Patient   Chief Complaint  Patient presents with  . Medication Refill    Effexor     History of Present Illness:  James Haynes is a 29 y.o. male patient who presents to [facility] for medication refill.   -no complaint or concerns or concerns at this time.  He is compliant on the effexor.  Reports no adverse effects.  Mood well controlled.  No suicidal or homicidal ideations.  Reports no family stressors.   Patient Active Problem List   Diagnosis Date Noted  . Bipolar 1 disorder, mixed, moderate ([place]) 08/03/2012    Past Medical History  Diagnosis Date  . Depression     History reviewed. No pertinent past surgical history.  Social History  Substance Use Topics  . Smoking status: Current Some Day Smoker  . Smokeless tobacco: None  . Alcohol Use: 1.2 oz/week    2 Cans of beer per week    History reviewed. No pertinent family history.  No Known Allergies  Medication list has been reviewed and updated.  Current Outpatient Prescriptions on File Prior to Visit  Medication Sig Dispense Refill  . venlafaxine XR (EFFEXOR-XR) 75 MG 24 hr capsule TAKE 3 CAPSULES BY MOUTH EVERY DAY  "NO MORE REFILLS WITHOUT OFFICE VISIT 30 capsule 0   No current facility-administered medications on file prior to visit.    ROS ROS otherwise unremarkabe unless listed above.  Physical Examination: BP 128/96 mmHg  Pulse 81  Temp(Src) 97.7 F (36.5 C) (Oral)  Resp 16  Ht 6' 1"  (1.854 m)  Wt 228 lb (103.42 kg)  BMI 30.09 kg/m2  SpO2 97% Ideal Body Weight: Weight in (lb) to have BMI = 25: 189.1  Physical Exam  Constitutional: He is oriented to person, place, and time. He appears well-developed and well-nourished. No distress.  HENT:  Head: Normocephalic and atraumatic.  Eyes: Conjunctivae and EOM are normal.  Pupils are equal, round, and reactive to light.  Cardiovascular: Normal rate and regular rhythm.  Exam reveals no gallop and no friction rub.   No murmur heard. Pulmonary/Chest: Effort normal. No respiratory distress. He has no wheezes. He has no rales.  Neurological: He is alert and oriented to person, place, and time.  Skin: Skin is warm and dry. He is not diaphoretic.  Psychiatric: He has a normal mood and affect. His behavior is normal.     Assessment and Plan: James Haynes is a 29 y.o. male who is here today for medication refill. Bipolar 1 disorder, mixed, moderate ([facility]) - Plan: venlafaxine XR (EFFEXOR-XR) 75 MG 24 hr capsule --this is stable, will refill for 6 month duration    Ivar Drape, PA-C [facility] 06/07/2015 10:46 AM

## 2015-06-11 ENCOUNTER — Encounter: Payer: Self-pay | Admitting: Physician Assistant

## 2015-08-15 ENCOUNTER — Other Ambulatory Visit: Payer: Self-pay | Admitting: Physician Assistant

## 2015-08-15 ENCOUNTER — Ambulatory Visit
Admission: RE | Admit: 2015-08-15 | Discharge: 2015-08-15 | Disposition: A | Discharge status: Home / Self Care | Payer: 59 | Source: Ambulatory Visit | Attending: Physician Assistant | Admitting: Physician Assistant

## 2015-08-15 DIAGNOSIS — R1031 Right lower quadrant pain: Secondary | ICD-10-CM | POA: Diagnosis present

## 2015-08-15 DIAGNOSIS — K625 Hemorrhage of anus and rectum: Secondary | ICD-10-CM

## 2015-08-15 DIAGNOSIS — R1032 Left lower quadrant pain: Secondary | ICD-10-CM

## 2015-08-15 MED ORDER — IOHEXOL 300 MG/ML  SOLN
100.0000 mL | Freq: Once | INTRAMUSCULAR | Status: AC | PRN
  Administered 2015-08-15: 100 mL via INTRAVENOUS

## 2015-08-17 ENCOUNTER — Other Ambulatory Visit
Admission: RE | Admit: 2015-08-17 | Discharge: 2015-08-17 | Disposition: A | Discharge status: Home / Self Care | Payer: 59 | Source: Ambulatory Visit | Attending: Physician Assistant | Admitting: Physician Assistant

## 2015-08-17 DIAGNOSIS — R197 Diarrhea, unspecified: Secondary | ICD-10-CM | POA: Insufficient documentation

## 2015-08-17 DIAGNOSIS — R935 Abnormal findings on diagnostic imaging of other abdominal regions, including retroperitoneum: Secondary | ICD-10-CM | POA: Insufficient documentation

## 2015-08-17 DIAGNOSIS — K625 Hemorrhage of anus and rectum: Secondary | ICD-10-CM | POA: Insufficient documentation

## 2015-08-17 DIAGNOSIS — R1032 Left lower quadrant pain: Secondary | ICD-10-CM | POA: Diagnosis present

## 2015-08-17 DIAGNOSIS — R1031 Right lower quadrant pain: Secondary | ICD-10-CM | POA: Diagnosis present

## 2015-08-17 LAB — GASTROINTESTINAL PANEL BY PCR, STOOL (REPLACES STOOL CULTURE)
ADENOVIRUS F40/41: NOT DETECTED
ASTROVIRUS: NOT DETECTED
CYCLOSPORA CAYETANENSIS: NOT DETECTED
Campylobacter species: NOT DETECTED
Cryptosporidium: NOT DETECTED
E. COLI O157: NOT DETECTED
ENTEROPATHOGENIC E COLI (EPEC): NOT DETECTED
Entamoeba histolytica: NOT DETECTED
Enteroaggregative E coli (EAEC): NOT DETECTED
Enterotoxigenic E coli (ETEC): NOT DETECTED
Giardia lamblia: NOT DETECTED
Norovirus GI/GII: NOT DETECTED
PLESIMONAS SHIGELLOIDES: NOT DETECTED
ROTAVIRUS A: NOT DETECTED
SALMONELLA SPECIES: NOT DETECTED
SHIGELLA/ENTEROINVASIVE E COLI (EIEC): NOT DETECTED
Sapovirus (I, II, IV, and V): NOT DETECTED
Shiga like toxin producing E coli (STEC): NOT DETECTED
VIBRIO SPECIES: NOT DETECTED
Vibrio cholerae: NOT DETECTED
YERSINIA ENTEROCOLITICA: NOT DETECTED

## 2015-09-05 ENCOUNTER — Other Ambulatory Visit: Payer: Self-pay | Admitting: Unknown Physician Specialty

## 2015-09-05 DIAGNOSIS — R935 Abnormal findings on diagnostic imaging of other abdominal regions, including retroperitoneum: Secondary | ICD-10-CM

## 2015-09-05 DIAGNOSIS — R933 Abnormal findings on diagnostic imaging of other parts of digestive tract: Secondary | ICD-10-CM

## 2015-09-05 DIAGNOSIS — K529 Noninfective gastroenteritis and colitis, unspecified: Secondary | ICD-10-CM

## 2015-09-11 ENCOUNTER — Ambulatory Visit
Admission: RE | Admit: 2015-09-11 | Discharge: 2015-09-11 | Disposition: A | Discharge status: Home / Self Care | Payer: 59 | Source: Ambulatory Visit | Attending: Unknown Physician Specialty | Admitting: Unknown Physician Specialty

## 2015-09-11 DIAGNOSIS — R933 Abnormal findings on diagnostic imaging of other parts of digestive tract: Secondary | ICD-10-CM

## 2015-09-11 DIAGNOSIS — K529 Noninfective gastroenteritis and colitis, unspecified: Secondary | ICD-10-CM | POA: Insufficient documentation

## 2015-09-11 DIAGNOSIS — R935 Abnormal findings on diagnostic imaging of other abdominal regions, including retroperitoneum: Secondary | ICD-10-CM

## 2015-09-11 MED ORDER — IOPAMIDOL (ISOVUE-300) INJECTION 61%
100.0000 mL | Freq: Once | INTRAVENOUS | Status: AC | PRN
  Administered 2015-09-11: 75 mL via INTRAVENOUS

## 2015-12-05 ENCOUNTER — Other Ambulatory Visit: Payer: Self-pay | Admitting: Physician Assistant

## 2016-02-19 DIAGNOSIS — I10 Essential (primary) hypertension: Secondary | ICD-10-CM | POA: Insufficient documentation

## 2016-03-08 ENCOUNTER — Other Ambulatory Visit: Payer: Self-pay | Admitting: Physician Assistant

## 2016-03-08 DIAGNOSIS — F3162 Bipolar disorder, current episode mixed, moderate: Secondary | ICD-10-CM

## 2016-03-10 ENCOUNTER — Encounter: Payer: Self-pay | Admitting: Urology

## 2016-03-10 ENCOUNTER — Ambulatory Visit (INDEPENDENT_AMBULATORY_CARE_PROVIDER_SITE_OTHER): Payer: 59 | Admitting: Urology

## 2016-03-10 VITALS — BP 138/89 | HR 67 | Ht 72.0 in | Wt 237.8 lb

## 2016-03-10 DIAGNOSIS — R351 Nocturia: Secondary | ICD-10-CM | POA: Diagnosis not present

## 2016-03-10 DIAGNOSIS — E291 Testicular hypofunction: Secondary | ICD-10-CM | POA: Diagnosis not present

## 2016-03-10 DIAGNOSIS — N529 Male erectile dysfunction, unspecified: Secondary | ICD-10-CM

## 2016-03-10 NOTE — Progress Notes (Signed)
03/10/2016 5:16 PM   James Haynes December 28, 1985 [id number]  Referring provider: Marinda Elk, MD [facility] [place], [place] [address]  Chief Complaint  Patient presents with  . New Patient (Initial Visit)    Testicular hypofunction referred by Paulita Cradle    HPI: Patient is a 29 year old Caucasian male who presents today as a referral for possible hypogonadism by his primary care physician Dr. Carrie Mew  Patient is experiencing a decrease in libido, a lack of energy, a decrease in strength, a decreased enjoyment in life, erections being less strong, a recent deterioration in an ability to play sports, falling asleep after dinner and a recent deterioration in their work performance.  This is indicated by his responses to the ADAM questionnaire.  He is no longer having spontaneous erections at night.  He does not have sleep apnea.         Androgen Deficiency in the Aging Male    [place] Name 03/10/16 1600         Androgen Deficiency in the Aging Male   Do you have a decrease in libido (sex drive) Yes     Do you have lack of energy Yes     Do you have a decrease in strength and/or endurance Yes     Have you lost height No     Have you noticed a decreased "enjoyment of life" Yes     Are you sad and/or grumpy No     Are your erections less strong Yes     Have you noticed a recent deterioration in your ability to play sports Yes     Are you falling asleep after dinner Yes     Has there been a recent deterioration in your work performance Yes       His SHIM score is 17, which is mild ED.   He has been having difficulty with erections for last couple of years.   His major complaint is maintaining an erection.  His libido is diminished.   His risk factors for ED are HTN, anxiety, depression, smoking, antidepressants,  and blood pressure medications.   He denies any painful erections or curvatures with his erections.        SHIM    Row Name  03/10/16 1641         SHIM: Over the last 6 months:   How do you rate your confidence that you could get and keep an erection? Moderate     When you had erections with sexual stimulation, how often were your erections hard enough for penetration (entering your partner)? Most Times (much more than half the time)     During sexual intercourse, how often were you able to maintain your erection after you had penetrated (entered) your partner? Difficult     During sexual intercourse, how difficult was it to maintain your erection to completion of intercourse? Slightly Difficult     When you attempted sexual intercourse, how often was it satisfactory for you? Difficult       SHIM Total Score   SHIM 17        Score: 1-7 Severe ED 8-11 Moderate ED 12-16 Mild-Moderate ED 17-21 Mild ED 22-25 No ED  His IPSS score today is 8, which is moderate lower urinary tract symptomatology. He is mixed with his quality life due to his urinary symptoms.  His major complaint today nocturia x 2.  He has had these symptoms for several years.  He denies any dysuria, hematuria or suprapubic pain.  He also denies any recent fevers, chills, nausea or vomiting.  Paternal grandfather had prostate cancer.     IPSS    Row Name 03/10/16 1600         International Prostate Symptom Score   How often have you had the sensation of not emptying your bladder? Less than half the time     How often have you had to urinate less than every two hours? About half the time     How often have you found you stopped and started again several times when you urinated? Not at All     How often have you found it difficult to postpone urination? Less than half the time     How often have you had a weak urinary stream? Not at All     How often have you had to strain to start urination? Not at All     How many times did you typically get up at night to urinate? 1 Time     Total IPSS Score 8       Quality of Life due to urinary  symptoms   If you were to spend the rest of your life with your urinary condition just the way it is now how would you feel about that? Mixed        Score:  1-7 Mild 8-19 Moderate 20-35 Severe   PMH: Past Medical History:  Diagnosis Date  . Anxiety   . Depression   . HTN (hypertension)   . Low testosterone     Surgical History: Past Surgical History:  Procedure Laterality Date  . FOOT SURGERY  2012    Home Medications:    Medication List       Accurate as of 03/10/16  5:16 PM. Always use your most recent med list.          colestipol 1 g tablet Commonly known as:  COLESTID Take by mouth.   ibuprofen 200 MG tablet Commonly known as:  ADVIL,MOTRIN Take by mouth.   lisinopril-hydrochlorothiazide 10-12.5 MG tablet Commonly known as:  PRINZIDE,ZESTORETIC Take by mouth.   venlafaxine XR 75 MG 24 hr capsule Commonly known as:  EFFEXOR-XR TAKE 3 CAPSULES BY MOUTH EVERY DAY       Allergies: No Known Allergies  Family History: Family History  Problem Relation Age of Onset  . Prostate cancer Paternal Grandfather   . Kidney cancer Neg Hx   . Bladder Cancer Neg Hx     Social History:  reports that he has been smoking E-cigarettes.  His smokeless tobacco use includes Chew. He reports that he drinks about 1.2 oz of alcohol per week . He reports that he does not use drugs.  ROS: UROLOGY Frequent Urination?: No Hard to postpone urination?: No Burning/pain with urination?: No Get up at night to urinate?: Yes Leakage of urine?: No Urine stream starts and stops?: No Trouble starting stream?: No Do you have to strain to urinate?: No Blood in urine?: No Urinary tract infection?: No Sexually transmitted disease?: No Injury to kidneys or bladder?: No Painful intercourse?: No Weak stream?: No Erection problems?: Yes Penile pain?: No  Gastrointestinal Nausea?: No Vomiting?: No Indigestion/heartburn?: No Diarrhea?: No Constipation?:  No  Constitutional Fever: No Night sweats?: No Weight loss?: No Fatigue?: Yes  Skin Skin rash/lesions?: No Itching?: No  Eyes Blurred vision?: No Double vision?: No  Ears/Nose/Throat Sore throat?: No Sinus problems?: No  Hematologic/Lymphatic Swollen glands?:  No Easy bruising?: No  Cardiovascular Leg swelling?: No Chest pain?: No  Respiratory Cough?: No Shortness of breath?: No  Endocrine Excessive thirst?: Yes  Musculoskeletal Back pain?: No Joint pain?: No  Neurological Headaches?: No Dizziness?: No  Psychologic Depression?: Yes Anxiety?: Yes  Physical Exam: BP 138/89   Pulse 67   Ht 6' (1.829 m)   Wt 237 lb 12.8 oz (107.9 kg)   BMI 32.25 kg/m   Constitutional: Well nourished. Alert and oriented, No acute distress. HEENT: [place] AT, moist mucus membranes. Trachea midline, no masses. Cardiovascular: No clubbing, cyanosis, or edema. Respiratory: Normal respiratory effort, no increased work of breathing. GI: Abdomen is soft, non tender, non distended, no abdominal masses. Liver and spleen not palpable.  No hernias appreciated.  Stool sample for occult testing is not indicated.   GU: No CVA tenderness.  No bladder fullness or masses.  Patient with circumcised phallus.  Urethral meatus is patent.  No penile discharge. No penile lesions or rashes. Scrotum without lesions, cysts, rashes and/or edema.  Testicles are located scrotally bilaterally. No masses are appreciated in the testicles. Left and right epididymis are normal. Rectal: Patient with  normal sphincter tone. Anus and perineum without scarring or rashes. No rectal masses are appreciated. Prostate is approximately 35 grams, no nodules are appreciated. Seminal vesicles are normal. Skin: No rashes, bruises or suspicious lesions. Lymph: No cervical or inguinal adenopathy. Neurologic: Grossly intact, no focal deficits, moving all 4 extremities. Psychiatric: Normal mood and affect.  Laboratory  Data: Lab Results  Component Value Date   WBC 10.6 (A) 11/13/2014   HGB 14.8 11/13/2014   HCT 44.1 11/13/2014   MCV 86.6 11/13/2014       Assessment & Plan:    1. Hypogonadism  - I explained to patient that the current recommendations from the Endocrine Society reports the diagnosis of hypogonadism requires a serum total testosterone level obtained between 8 and 10 AM at least 2 days apart that is below the laboratory parameters  for normal testosterone.    - At this time, the patient does not meet this requirement.  He will return in the morning before 10 AM for his first serum testosterone draw. He will then return next week for his second serum testosterone draw before 10 AM.  - I discussed with the patient the side effects of testosterone therapy, such as: enlargement of the prostate gland that may in turn cause LUTS, possible increased risk of PCa, DVT's and/or PE's, possible increased risk of heart attack or stroke, lower sperm count, swelling of the ankles, feet, or body, with or without heart failure, enlarged or painful breasts, have problems breathing while you sleep (sleep apnea), increased prostate specific antigen, mood swings, hypertension and increased red blood cell count.  - I also discussed that some men have had success using clomid for hypogonadism.  It does seem to be more successful in younger men, but there are incidences of good results in middle-aged men.  I explained that it is used in male infertility to stimulate the testicles to make more testosterone/sperm.  There has been no long term data on side effects, but some urologists has been having success with this medication.   2. Erectile dysfunction  - SHIM score is 17.   I explained to the patient that in order to achieve an erection it takes good functioning of the nervous system (parasympathetic, sympathetic, sensory and motor), good blood flow into the erectile tissue of the penis and a desire  to have sex.   I  stated that conditions like diabetes, hypertension, coronary artery disease, peripheral vascular disease, smoking, alcohol consumption, age and BPH can diminish the ability to have an erection.     - advised patient that "vaping" can still be detrimental to erections  3. Nocturia  - IPSS score is 8/3  - Continue conservative management, avoiding bladder irritants and timed voiding's  - I explained to the patient that nocturia is often multi-factorial and difficult to treat.  Sleeping disorders, heart conditions and peripheral vascular disease, diabetes,  enlarged prostate or urethral stricture causing bladder outlet obstruction and/or certain medications.  - I have suggested that the patient avoid caffeine and alcohol in the evening. He may also benefit from fluid restrictions after 6:00 in the evening and voiding just prior to bedtime.  - Research studies have showed that over 84% of patients with sleep apnea reported frequent nighttime urination.   With sleep apnea, oxygen decreases, carbon dioxide increases, the blood become more acidic, the heart rate drops and blood vessels in the lung constrict.  The body is then alerted that something is very wrong. The sleeper must wake enough to reopen the airway. By this time, the heart is racing and experiences a false signal of fluid overload. The heart excretes a hormone-like protein that tells the body to get rid of sodium and water, resulting in nocturia.  - The patient may also benefit from a discussion with his primary care physician to see if he has risk factors for sleep apnea or other sleep disturbances and obtaining a sleep study.  - He will need to be evaluated for his risk factors for sleep apnea if he pursue exogenous testosterone therapy   Return for patient to RTC for a morning testosterone before 10 AM two days apart.  These notes generated with voice recognition software. I apologize for typographical errors.  Zara Council,  [facility] [address] [place], [place] [address] [phone]

## 2016-09-14 DIAGNOSIS — D229 Melanocytic nevi, unspecified: Secondary | ICD-10-CM | POA: Diagnosis not present

## 2016-09-14 DIAGNOSIS — S59901A Unspecified injury of right elbow, initial encounter: Secondary | ICD-10-CM | POA: Diagnosis not present

## 2016-09-14 DIAGNOSIS — I10 Essential (primary) hypertension: Secondary | ICD-10-CM | POA: Diagnosis not present

## 2016-09-14 DIAGNOSIS — R490 Dysphonia: Secondary | ICD-10-CM | POA: Diagnosis not present

## 2016-09-14 DIAGNOSIS — M778 Other enthesopathies, not elsewhere classified: Secondary | ICD-10-CM | POA: Diagnosis not present

## 2016-10-28 DIAGNOSIS — I10 Essential (primary) hypertension: Secondary | ICD-10-CM | POA: Diagnosis not present

## 2016-10-29 DIAGNOSIS — D225 Melanocytic nevi of trunk: Secondary | ICD-10-CM | POA: Diagnosis not present

## 2016-10-29 DIAGNOSIS — D485 Neoplasm of uncertain behavior of skin: Secondary | ICD-10-CM | POA: Diagnosis not present

## 2016-10-29 DIAGNOSIS — D2361 Other benign neoplasm of skin of right upper limb, including shoulder: Secondary | ICD-10-CM | POA: Diagnosis not present

## 2016-12-08 DIAGNOSIS — H903 Sensorineural hearing loss, bilateral: Secondary | ICD-10-CM | POA: Diagnosis not present

## 2017-03-04 ENCOUNTER — Other Ambulatory Visit: Payer: Self-pay | Admitting: Family Medicine

## 2017-03-04 DIAGNOSIS — R1902 Left upper quadrant abdominal swelling, mass and lump: Secondary | ICD-10-CM | POA: Diagnosis not present

## 2017-03-04 DIAGNOSIS — R1012 Left upper quadrant pain: Secondary | ICD-10-CM

## 2017-03-08 ENCOUNTER — Ambulatory Visit
Admission: RE | Admit: 2017-03-08 | Discharge: 2017-03-08 | Disposition: A | Discharge status: Home / Self Care | Payer: 59 | Source: Ambulatory Visit | Attending: Family Medicine | Admitting: Family Medicine

## 2017-03-08 DIAGNOSIS — R932 Abnormal findings on diagnostic imaging of liver and biliary tract: Secondary | ICD-10-CM | POA: Diagnosis not present

## 2017-03-08 DIAGNOSIS — R1012 Left upper quadrant pain: Secondary | ICD-10-CM

## 2017-03-08 DIAGNOSIS — K824 Cholesterolosis of gallbladder: Secondary | ICD-10-CM | POA: Insufficient documentation

## 2017-03-24 ENCOUNTER — Other Ambulatory Visit: Payer: Self-pay | Admitting: General Surgery

## 2017-03-24 DIAGNOSIS — R109 Unspecified abdominal pain: Secondary | ICD-10-CM | POA: Diagnosis not present

## 2017-04-02 ENCOUNTER — Ambulatory Visit: Payer: 59

## 2017-04-07 ENCOUNTER — Ambulatory Visit
Admission: RE | Admit: 2017-04-07 | Discharge: 2017-04-07 | Disposition: A | Discharge status: Home / Self Care | Payer: 59 | Source: Ambulatory Visit | Attending: General Surgery | Admitting: General Surgery

## 2017-04-07 DIAGNOSIS — R109 Unspecified abdominal pain: Secondary | ICD-10-CM

## 2017-04-07 DIAGNOSIS — K760 Fatty (change of) liver, not elsewhere classified: Secondary | ICD-10-CM | POA: Diagnosis not present

## 2017-04-07 DIAGNOSIS — K639 Disease of intestine, unspecified: Secondary | ICD-10-CM | POA: Insufficient documentation

## 2017-04-07 MED ORDER — IOPAMIDOL (ISOVUE-300) INJECTION 61%
100.0000 mL | Freq: Once | INTRAVENOUS | Status: AC | PRN
  Administered 2017-04-07: 100 mL via INTRAVENOUS

## 2017-10-13 DIAGNOSIS — Z0000 Encounter for general adult medical examination without abnormal findings: Secondary | ICD-10-CM | POA: Diagnosis not present

## 2017-10-13 DIAGNOSIS — I10 Essential (primary) hypertension: Secondary | ICD-10-CM | POA: Diagnosis not present

## 2017-11-26 DIAGNOSIS — Z0000 Encounter for general adult medical examination without abnormal findings: Secondary | ICD-10-CM | POA: Diagnosis not present

## 2017-11-26 DIAGNOSIS — I10 Essential (primary) hypertension: Secondary | ICD-10-CM | POA: Diagnosis not present

## 2017-12-27 DIAGNOSIS — K824 Cholesterolosis of gallbladder: Secondary | ICD-10-CM | POA: Diagnosis not present

## 2017-12-27 DIAGNOSIS — K529 Noninfective gastroenteritis and colitis, unspecified: Secondary | ICD-10-CM | POA: Diagnosis not present

## 2017-12-27 DIAGNOSIS — R1013 Epigastric pain: Secondary | ICD-10-CM | POA: Diagnosis not present

## 2017-12-28 DIAGNOSIS — J029 Acute pharyngitis, unspecified: Secondary | ICD-10-CM | POA: Diagnosis not present

## 2017-12-29 NOTE — Progress Notes (Deleted)
12/30/2017 9:53 PM   James Haynes 12-30-1985 [id number]  Referring provider: Marinda Elk, [facility] [place] [place] [address]  No chief complaint on file.   HPI: Patient is a 32 year old Caucasian male with LU TS and ED who presents today for a follow up.    Patient is experiencing a decrease in libido, a lack of energy, a decrease in strength, a decreased enjoyment in life, erections being less strong, a recent deterioration in an ability to play sports, falling asleep after dinner and a recent deterioration in their work performance.  This is indicated by his responses to the ADAM questionnaire.  He is no longer having spontaneous erections at night.  He does not have sleep apnea.       Erectile dysfunction His SHIM score is 17, which is mild ED.   He has been having difficulty with erections for last couple of years.   His previous SHIM score was 17.  His major complaint is maintaining an erection.  His libido is diminished.   His risk factors for ED are HTN, anxiety, depression, smoking and antidepressants.     Score: 1-7 Severe ED 8-11 Moderate ED 12-16 Mild-Moderate ED 17-21 Mild ED 22-25 No ED  Family history of prostate cancer Paternal grandfather with prostate cancer   PMH: Past Medical History:  Diagnosis Date  . Anxiety   . Depression   . HTN (hypertension)   . Low testosterone     Surgical History: Past Surgical History:  Procedure Laterality Date  . FOOT SURGERY  2012    Home Medications:  Allergies as of 12/30/2017   No Known Allergies     Medication List        Accurate as of 12/29/17  9:53 PM. Always use your most recent med list.          colestipol 1 g tablet Commonly known as:  COLESTID Take by mouth.   ibuprofen 200 MG tablet Commonly known as:  ADVIL,MOTRIN Take by mouth.   lisinopril-hydrochlorothiazide 10-12.5 MG tablet Commonly known as:  PRINZIDE,ZESTORETIC Take by mouth.   venlafaxine XR 75 MG 24 hr capsule Commonly known as:  EFFEXOR-XR TAKE 3 CAPSULES BY MOUTH EVERY DAY       Allergies: No Known Allergies  Family History: Family History  Problem Relation Age of Onset  . Prostate cancer Paternal Grandfather   . Kidney cancer Neg Hx   . Bladder Cancer Neg Hx     Social History:  reports that he has been smoking e-cigarettes.  His smokeless tobacco use includes chew. He reports that he drinks about 1.2 oz of alcohol per week. He reports that he does not use drugs.  ROS:                                        Physical Exam: There were no vitals taken for this visit.  Constitutional: Well nourished. Alert and oriented, No acute distress. HEENT: [place] AT, moist mucus membranes. Trachea midline, no masses. Cardiovascular: No clubbing, cyanosis, or edema. Respiratory: Normal respiratory effort, no increased work of breathing. GI: Abdomen is soft, non tender, non distended, no abdominal masses. Liver and spleen not palpable.  No hernias appreciated.  Stool sample for occult testing is not indicated.   GU: No CVA tenderness.  No bladder fullness or masses.  Patient with circumcised phallus.  Urethral meatus is patent.  No penile discharge. No penile lesions or rashes. Scrotum without lesions, cysts, rashes and/or edema.  Testicles are located scrotally bilaterally. No masses are appreciated in the testicles. Left and right epididymis are normal. Rectal: Patient with  normal sphincter tone. Anus and perineum without scarring or rashes. No rectal masses are appreciated. Prostate is approximately 35 grams, no nodules are appreciated. Seminal vesicles are normal. Skin: No rashes, bruises or suspicious lesions. Lymph: No cervical or inguinal adenopathy. Neurologic: Grossly intact, no focal deficits, moving all 4 extremities. Psychiatric: Normal mood and affect.   Laboratory Data: Lab Results  Component Value Date   WBC 10.6 (A)  11/13/2014   HGB 14.8 11/13/2014   HCT 44.1 11/13/2014   MCV 86.6 11/13/2014       Assessment & Plan:    1.   2. Erectile dysfunction  - SHIM score is 17.   I explained to the patient that in order to achieve an erection it takes good functioning of the nervous system (parasympathetic, sympathetic, sensory and motor), good blood flow into the erectile tissue of the penis and a desire to have sex.   I stated that conditions like diabetes, hypertension, coronary artery disease, peripheral vascular disease, smoking, alcohol consumption, age and BPH can diminish the ability to have an erection.     - advised patient that "vaping" can still be detrimental to erections  3. Nocturia   No follow-ups on file.  These notes generated with voice recognition software. I apologize for typographical errors.  Zara Council, PA-C  [facility] [address] [place], [place] [address] [phone]

## 2017-12-30 ENCOUNTER — Encounter: Payer: Self-pay | Admitting: Urology

## 2017-12-30 ENCOUNTER — Ambulatory Visit: Payer: 59 | Admitting: Urology

## 2017-12-30 DIAGNOSIS — J039 Acute tonsillitis, unspecified: Secondary | ICD-10-CM | POA: Diagnosis not present

## 2017-12-30 DIAGNOSIS — I10 Essential (primary) hypertension: Secondary | ICD-10-CM | POA: Diagnosis not present

## 2018-05-20 DIAGNOSIS — E291 Testicular hypofunction: Secondary | ICD-10-CM | POA: Diagnosis not present

## 2018-05-20 DIAGNOSIS — E781 Pure hyperglyceridemia: Secondary | ICD-10-CM | POA: Diagnosis not present

## 2018-05-27 DIAGNOSIS — I10 Essential (primary) hypertension: Secondary | ICD-10-CM | POA: Diagnosis not present

## 2018-05-27 DIAGNOSIS — E349 Endocrine disorder, unspecified: Secondary | ICD-10-CM | POA: Diagnosis not present

## 2018-05-27 DIAGNOSIS — E781 Pure hyperglyceridemia: Secondary | ICD-10-CM | POA: Diagnosis not present

## 2018-07-05 DIAGNOSIS — R112 Nausea with vomiting, unspecified: Secondary | ICD-10-CM | POA: Diagnosis not present

## 2018-07-05 DIAGNOSIS — R197 Diarrhea, unspecified: Secondary | ICD-10-CM | POA: Diagnosis not present

## 2018-07-06 ENCOUNTER — Encounter: Payer: Self-pay | Admitting: Urology

## 2018-07-06 ENCOUNTER — Ambulatory Visit (INDEPENDENT_AMBULATORY_CARE_PROVIDER_SITE_OTHER): Payer: 59 | Admitting: Urology

## 2018-07-06 VITALS — BP 177/125 | HR 86 | Ht 72.0 in | Wt 252.1 lb

## 2018-07-06 DIAGNOSIS — E291 Testicular hypofunction: Secondary | ICD-10-CM | POA: Diagnosis not present

## 2018-07-06 NOTE — Patient Instructions (Signed)
Testosterone Replacement Therapy  Testosterone replacement therapy (TRT) is used to treat men who have a low testosterone level (hypogonadism). Testosterone is a male hormone that is produced in the testicles. It is responsible for typically male characteristics and for maintaining a man's sex drive and the ability to get an erection. Testosterone also supports bone and muscle health. TRT can be a gel, liquid, or patch that you put on your skin. It can also be in the form of a tablet or an injection. In some cases, your health care provider may insert long-acting pellets under your skin. In most men, the level of testosterone starts to decline gradually after age 58. Low testosterone can also be caused by certain medical conditions, medicines, and obesity. Your health care provider can diagnose hypogonadism with at least two blood tests that are done early in the morning. Low testosterone may not need to be treated. TRT is usually a choice that you make with your health care provider. Your health care provider may recommend TRT if you have low testosterone that is causing symptoms, such as:  Low sex drive.  Erection problems.  Breast enlargement.  Loss of body hair.  Weak muscles or bones.  Shrinking testicles.  Increased body fat.  Low energy.  Hot flashes.  Depression.  Decreased work Systems analyst. TRT is a lifetime treatment. If you stop treatment, your testosterone will drop, and your symptoms may return. What are the risks? Testosterone replacement therapy may have side effects, including:  Lower sperm count.  Skin irritation at the application or injection site.  Mouth irritation if you take an oral tablet.  Acne.  Swelling of your legs or feet.  Tender breasts.  Dizziness.  Sleep disturbance.  Mood swings.  Possible increased risk of stroke or heart attack. Testosterone replacement therapy may also increase your risk for prostate cancer or male breast cancer.  You should not use TRT if you have either of those conditions. Your health care provider also may not recommend TRT if:  You are suspected of having prostate cancer.  You want to father a child.  You have a high number of red blood cells.  You have untreated sleep apnea.  You have a very large prostate. Supplies needed:  Your health care provider will prescribe the testosterone gel, solution, or medicine that you need. If your health care provider teaches you to do self-injections at home, you will also need: ? Your medicine vial. ? Disposable needles and syringes. ? Alcohol swabs. ? A needle disposal container. ? Adhesive bandages. How to use testosterone replacement therapy Your health care provider will help you find the TRT option that will work best for you based on your preference, the side effects, and the cost. You may:  Rub testosterone gel on your upper arm or shoulder every day after a shower. This is the most common type of TRT. Do not let women or children come in contact with the gel.  Apply a testosterone solution under your arms once each day.  Place a testosterone patch on your skin once each day.  Dissolve a testosterone tablet in your mouth twice each day.  Have a testosterone pellet inserted under your skin by your health care provider. This will be replaced every 3-6 months.  Use testosterone nasal spray three times each day.  Get testosterone injections. For some types of testosterone, your health care provider will give you this injection. With other types of testosterone, you may be taught to give injections to  yourself. The frequency of injections may vary based on the type of testosterone that you receive. Follow these instructions at home:  Take over-the-counter and prescription medicines only as told by your health care provider.  Lose weight if you are overweight. Ask your health care provider to help you start a healthy diet and exercise program to  reach and maintain a healthy weight.  Work with your health care provider to treat other medical conditions that may lower your testosterone. These include obesity, high blood pressure, high cholesterol, diabetes, liver disease, kidney disease, and sleep apnea.  Keep all follow-up visits as told by your health care provider. This is important. General recommendations  Discuss all risks and benefits with your health care provider before starting therapy.  Work with your health care provider to check your prostate health and do blood testing before you start therapy.  Do not use any testosterone replacement therapies that are not prescribed by your health care provider or not approved for use in the [place]  Do not use TRT for bodybuilding or to improve sexual performance. TRT should be used only to treat symptoms of low testosterone.  Return for all repeat prostate checks and blood tests during therapy, as told by your health care provider. Where to find more information Learn more about testosterone replacement therapy from:  [place]: www.urologyhealth.org/urologic-conditions/low-testosterone-(hypogonadism)  Endocrine Society: www.hormone.org/diseases-and-conditions/mens-health/hypogonadism Contact a health care provider if:  You have side effects from your testosterone replacement therapy.  You continue to have symptoms of low testosterone during treatment.  You develop new symptoms during treatment. Summary  Testosterone replacement therapy is only for men who have low testosterone as determined by blood testing and who have symptoms of low testosterone.  Testosterone replacement therapy should be prescribed only by a health care provider and should be used under the supervision of a health care provider.  You may not be able to take testosterone if you have certain medical conditions, including prostate cancer, male breast cancer, or heart  disease.  Testosterone replacement therapy may have side effects and may make some medical conditions worse.  Talk with your health care provider about all the risks and benefits before you start therapy. This information is not intended to replace advice given to you by your health care provider. Make sure you discuss any questions you have with your health care provider. Document Released: 02/13/2016 Document Revised: 02/13/2016 Document Reviewed: 02/13/2016 Elsevier Interactive Patient Education  2019 [place]. Testosterone Test Why am I having this test? Testosterone is a hormone made by the adrenal glands in the abdomen in both males and females. In males, it is also made by the testicles. Starting at puberty, testosterone stimulates the development of secondary sex characteristics in males. This includes a deeper voice, muscle and body hair growth, and penis enlargement. In females, testosterone is also produced in the ovaries. The male body converts testosterone into estradiol, the main male sex hormone. An abnormal level of testosterone can cause health issues in both males and females. You may have this test if your health care provider suspects that an abnormal testosterone level is causing or contributing to other health problems. In males, an abnormally low testosterone level can cause:  Inability to have children (infertility).  Trouble getting or maintaining an erection (erectile dysfunction).  Delayed puberty. In females, an abnormally high testosterone level can cause:  Infertility.  Polycystic ovary syndrome (PCOS).  Development of masculine features (virilization). What is being tested? This test measures the  amount of total testosterone in your blood. What kind of sample is taken?  A blood sample is required for this test. It is usually collected by inserting a needle into a blood vessel. The sample is most often collected in the morning because that is when  testosterone is usually the highest. Tell a health care provider about:  Any allergies you have.  All medicines you are taking, including vitamins, herbs, eye drops, creams, and over-the-counter medicines.  Any blood disorders you have.  Any surgeries you have had.  Any medical conditions you have.  Whether you are pregnant or may be pregnant. How are the results reported? Your test results will be reported as a value that indicates how much testosterone is in your blood. This will be given as nanograms of testosterone per deciliter of blood (ng/dL). Your health care provider will compare your test results to normal ranges that were established after testing a large group of people (reference ranges). Reference ranges may vary among labs and hospitals. For this test, common reference ranges for total testosterone are:  Male: ? 7 months to 33 years old: less than 30 ng/dL. ? 56-29 years old: less than 300 ng/dL. ? 78-66 years old: 170-540 ng/dL. ? 14-64 years old: 250-910 ng/dL. ? 20 years and older: 280-1,080 ng/dL.  Male: ? 7 months to 33 years old: less than 30 ng/dL. ? 13-55 years old: less than 40 ng/dL. ? 28-27 years old: less than 60 ng/dL. ? 72-69 years old: less than 70 ng/dL. ? 20 years and older: less than 70 ng/dL. What do the results mean? A result that is within your reference range means that you have a normal amount of testosterone in your blood. In males:  A high testosterone level may mean that you: ? Have certain types of tumors. ? Have an overactive thyroid gland (hyperthyroidism). ? Currently use anabolic steroids or used anabolic steroids in the past. ? Have an inherited disorder that affects the adrenal glands (congenital adrenal hyperplasia). ? Are starting puberty early (precocious puberty).  A low testosterone level may mean that you: ? Have certain genetic diseases. ? Have had certain viral infections, such as mumps. ? Have a condition that  affects the pituitary gland. ? Have injured your testicles. In females:  A high testosterone level may mean that you have: ? Certain types of tumors, such as ovarian or adrenal gland tumors. ? An inherited disorder that affects certain cells in the adrenal glands (congenital adrenal hyperplasia). ? Polycystic ovary syndrome.  A low testosterone level usually will not cause health problems. Talk with your health care provider about what your results mean. Questions to ask your health care provider Ask your health care provider, or the department that is doing the test:  When will my results be ready?  How will I get my results?  What are my treatment options?  What other tests do I need?  What are my next steps? Summary  Testosterone is a hormone made by the adrenal glands in the abdomen in both males and females. In males, it is also made by the testicles. Starting at puberty, testosterone stimulates the development of secondary sex characteristics in males.  In females, testosterone is also produced in the ovaries. The male body converts testosterone into estradiol, the main male sex hormone.  An abnormal level of testosterone can cause health issues in both males and females. You may have this test if your health care provider suspects that an abnormal testosterone  level is causing or contributing to other health problems. This information is not intended to replace advice given to you by your health care provider. Make sure you discuss any questions you have with your health care provider. Document Released: 06/11/2004 Document Revised: 02/23/2017 Document Reviewed: 02/23/2017 Elsevier Interactive Patient Education  2019 [employer].

## 2018-07-07 ENCOUNTER — Encounter: Payer: Self-pay | Admitting: Urology

## 2018-07-07 LAB — LUTEINIZING HORMONE: LH: 8.6 m[IU]/mL (ref 1.7–8.6)

## 2018-07-07 NOTE — Progress Notes (Signed)
07/06/2018 9:03 AM   James Haynes June 02, 1986 [id number]  Referring provider: Marinda Elk, [facility] [place], [place] [address]  Chief Complaint  Patient presents with  . Hypogonadism    HPI: 33 year old male seen at the request of Paulita Cradle for evaluation of hypogonadism.  He has been complaining of tiredness, fatigue and decreased libido since 2017 and his total testosterone levels were in the low normal range. He did see Zara Council in our office on October 2017 and follow-up blood work was recommended however he never returned.  He did have testosterone levels at [facility] clinic in December 2019 which were low at 139 and 255 ng/dL.  He has mild ED but not bothersome.  He is married and does desire additional children.  In addition to his tiredness and fatigue he does complain of difficulty concentrating.  He has no bothersome lower urinary tract symptoms.  He does have a history of depression and is on Effexor.  PMH: Past Medical History:  Diagnosis Date  . Anxiety   . Depression   . HTN (hypertension)   . Low testosterone     Surgical History: Past Surgical History:  Procedure Laterality Date  . FOOT SURGERY  2012    Home Medications:  Allergies as of 07/06/2018   No Known Allergies     Medication List       Accurate as of July 06, 2018 11:59 PM. Always use your most recent med list.        amLODipine 5 MG tablet Commonly known as:  NORVASC Take by mouth.   losartan 100 MG tablet Commonly known as:  COZAAR Take by mouth.   omeprazole 40 MG capsule Commonly known as:  PRILOSEC Take by mouth.   venlafaxine XR 75 MG 24 hr capsule Commonly known as:  EFFEXOR-XR TAKE 3 CAPSULES BY MOUTH EVERY DAY       Allergies: No Known Allergies  Family History: Family History  Problem Relation Age of Onset  . Prostate cancer Paternal Grandfather   . Kidney cancer Neg Hx   . Bladder Cancer Neg Hx      Social History:  reports that he has quit smoking. His smoking use included cigarettes. His smokeless tobacco use includes chew. He reports current alcohol use of about 2.0 standard drinks of alcohol per week. He reports that he does not use drugs.  ROS: UROLOGY Frequent Urination?: No Hard to postpone urination?: No Burning/pain with urination?: No Get up at night to urinate?: Yes Leakage of urine?: No Urine stream starts and stops?: No Trouble starting stream?: No Do you have to strain to urinate?: No Blood in urine?: No Urinary tract infection?: No Sexually transmitted disease?: No Injury to kidneys or bladder?: No Painful intercourse?: No Weak stream?: No Erection problems?: No Penile pain?: No  Gastrointestinal Nausea?: No Vomiting?: No Indigestion/heartburn?: No Diarrhea?: No Constipation?: No  Constitutional Fever: No Night sweats?: No Weight loss?: No Fatigue?: Yes  Skin Skin rash/lesions?: No Itching?: No  Eyes Blurred vision?: No Double vision?: No  Ears/Nose/Throat Sore throat?: No Sinus problems?: No  Hematologic/Lymphatic Swollen glands?: No Easy bruising?: No  Cardiovascular Leg swelling?: No Chest pain?: No  Respiratory Cough?: No Shortness of breath?: No  Endocrine Excessive thirst?: Yes  Musculoskeletal Back pain?: No Joint pain?: No  Neurological Headaches?: No Dizziness?: No  Psychologic Depression?: Yes Anxiety?: Yes  Physical Exam: BP (!) 177/125 (BP Location: Left Arm, Patient Position: Sitting, Cuff Size: Large) Comment:  Pt is out of BP meds  Pulse 86   Ht 6' (1.829 m)   Wt 252 lb 1.6 oz (114.4 kg)   BMI 34.19 kg/m   Constitutional:  Alert and oriented, No acute distress. HEENT: [place] AT, moist mucus membranes.  Trachea midline, no masses. Cardiovascular: No clubbing, cyanosis, or edema. Respiratory: Normal respiratory effort, no increased work of breathing. GI: Abdomen is soft, nontender, nondistended, no  abdominal masses GU: No CVA tenderness.  Phallus without lesions.  Testes descended bilaterally with a estimated volume of a >20 cc bilaterally.  Cord/epididymes palpably normal. Lymph: No cervical or inguinal lymphadenopathy. Skin: No rashes, bruises or suspicious lesions. Neurologic: Grossly intact, no focal deficits, moving all 4 extremities. Psychiatric: Normal mood and affect.   Assessment & Plan:   33 year old male with symptomatic hypogonadism.  He states he is interested in additional children and was informed that TRT would significantly lower his sperm count.  Although this is typically reversible when stopping replacement there have been reported cases where sperm counts do not recover.  I have recommended checking an LH and if not elevated an initial trial of Clomid. He will be notified with the lab results and further recommendations.  If he is started on Clomid follow-up 6 weeks for symptom reassessment and repeat testosterone level.  If his LH is elevated or the upper range of normal we did discuss TRT. Potential side effects of testosterone replacement were discussed including stimulation of benign prostatic growth with lower urinary tract symptoms; erythrocytosis; edema; gynecomastia; worsening sleep apnea; venous thromboembolism; testicular atrophy and infertility. Recent studies suggesting an increased incidence of heart attack and stroke in patients taking testosterone was discussed. He was informed there is conflicting evidence regarding the impact of testosterone therapy on cardiovascular risk. The theoretical risk of growth stimulation of an undetected prostate cancer was also discussed.  He was informed that current evidence does not provide any definitive answers regarding the risks of testosterone therapy on prostate cancer and cardiovascular disease. The need for periodic monitoring of his testosterone level, PSA, hematocrit and DRE was discussed.   Abbie Sons,  [place] [address], [place], [place] [address] [phone]

## 2018-07-08 ENCOUNTER — Telehealth: Payer: Self-pay | Admitting: Urology

## 2018-07-08 NOTE — Telephone Encounter (Signed)
Called patient and advised of results and  Dr Dagoberto Reef message and advised that Clomid was sent to the pharmacy. Scheduled 6 week fu appt with Dr Bernardo Heater with a lab appt for Testosterone 2 days prior. Confirmed appts with patient.

## 2018-07-08 NOTE — Telephone Encounter (Signed)
-----   Message from Abbie Sons, MD sent at 07/08/2018  9:16 AM EST ----- LH was normal.  Based on age would recommend an initial trial of Clomid.  We discussed this at the recent office visit.  Rx was sent to pharmacy.  Please schedule follow-up appointment with me in 6 weeks with a testosterone level prior

## 2018-07-08 NOTE — Telephone Encounter (Signed)
Pt called and wants a call back about his lab results that he received via Mychart.

## 2018-07-12 ENCOUNTER — Telehealth: Payer: Self-pay | Admitting: Urology

## 2018-07-12 MED ORDER — CLOMIPHENE CITRATE 50 MG PO TABS: 25.0000 mg | ORAL_TABLET | Freq: Every day | ORAL | 0 refills | Status: DC

## 2018-07-12 NOTE — Telephone Encounter (Signed)
Pt called and states that he still hasn't got his medicine that was supposed to be called in last week. He wanted to clarify that [facility] on [address] is were it needs to be sent.

## 2018-07-12 NOTE — Telephone Encounter (Signed)
rx sent

## 2018-07-13 NOTE — Telephone Encounter (Signed)
Left message on patients cell phone per DPR that Rx was sent to pharmacy.

## 2018-07-18 ENCOUNTER — Telehealth: Payer: Self-pay | Admitting: Urology

## 2018-07-18 NOTE — Telephone Encounter (Signed)
Pt called and states that his insurance will not cover Clomid or the generic. Pt states that hies insurance will cover Testosterone if someone could please call that in for him.

## 2018-07-19 NOTE — Telephone Encounter (Signed)
Is testosterone an option for this patient, please see his message below and advise. Thanks

## 2018-07-21 NOTE — Telephone Encounter (Signed)
I would let him know that testosterone will significantly impair his sperm count and there have been reported occasions where this is not reversible.  The cost of Clomid out-of-pocket using [facility] is 15-$20 per month.  I would recommend at least trying Clomid for 3 months.

## 2018-07-28 NOTE — Telephone Encounter (Signed)
Pt called office and I read message from [facility].  He is going to try having RX filled using [facility].

## 2018-08-01 DIAGNOSIS — J111 Influenza due to unidentified influenza virus with other respiratory manifestations: Secondary | ICD-10-CM | POA: Diagnosis not present

## 2018-08-01 DIAGNOSIS — R6889 Other general symptoms and signs: Secondary | ICD-10-CM | POA: Diagnosis not present

## 2018-08-16 ENCOUNTER — Other Ambulatory Visit: Payer: Self-pay

## 2018-08-16 DIAGNOSIS — E291 Testicular hypofunction: Secondary | ICD-10-CM

## 2018-08-17 ENCOUNTER — Other Ambulatory Visit: Payer: Self-pay

## 2018-08-17 ENCOUNTER — Other Ambulatory Visit: Payer: 59

## 2018-08-17 DIAGNOSIS — E291 Testicular hypofunction: Secondary | ICD-10-CM | POA: Diagnosis not present

## 2018-08-18 LAB — TESTOSTERONE: TESTOSTERONE: 528 ng/dL (ref 264–916)

## 2018-08-19 ENCOUNTER — Ambulatory Visit: Payer: Self-pay | Admitting: Urology

## 2018-08-23 ENCOUNTER — Ambulatory Visit: Payer: Self-pay | Admitting: Urology

## 2018-09-27 ENCOUNTER — Ambulatory Visit: Payer: Self-pay | Admitting: Urology

## 2018-09-28 ENCOUNTER — Encounter: Payer: Self-pay | Admitting: Urology

## 2018-09-28 ENCOUNTER — Ambulatory Visit: Payer: Self-pay | Admitting: Urology

## 2018-10-17 IMAGING — CT CT ABD-PELV W/ CM
1 of 2 series · 15 of 32 positions shown, 19 images · IV contrast (APPLIED)
Comparison: Ultrasound 03/08/2017.  CT 09/11/2015.

CLINICAL DATA: Left side abdominal pain, noticeable lump for a few
weeks.

EXAM:
CT ABDOMEN AND PELVIS WITH CONTRAST
TECHNIQUE: Multidetector CT imaging of the abdomen and pelvis was performed
using the standard protocol following bolus administration of
intravenous contrast.
CONTRAST:  100mL [id number] IOPAMIDOL (RP16G0-0XX) INJECTION 61%

[Series 2: axial st · axial · 0.93mm/px · z∈[-1112,-602]mm · 15 of 112 slices shown, 19 images]
[im 5/112  soft-tissue]
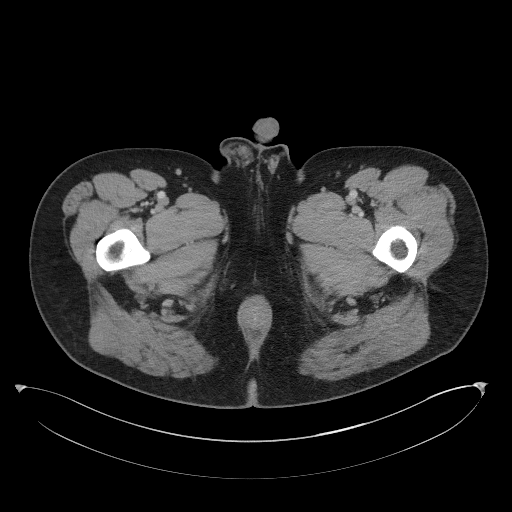
[im 5/112  bone]
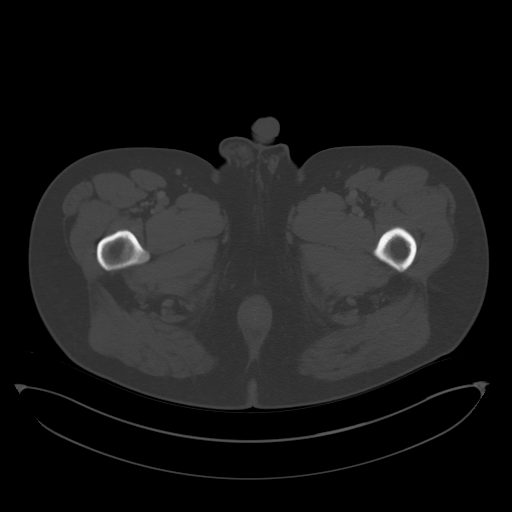
[im 15/112  soft-tissue]
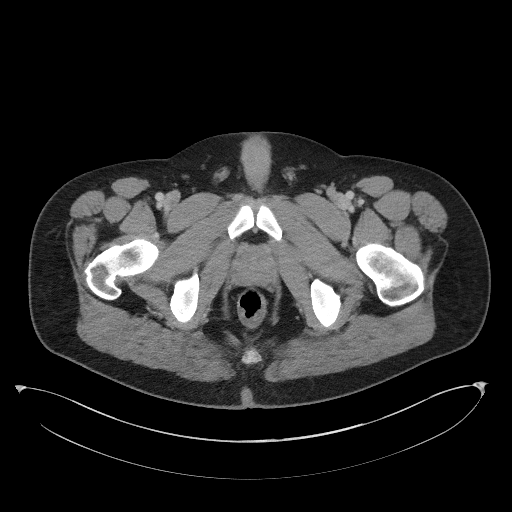
[im 25/112  soft-tissue]
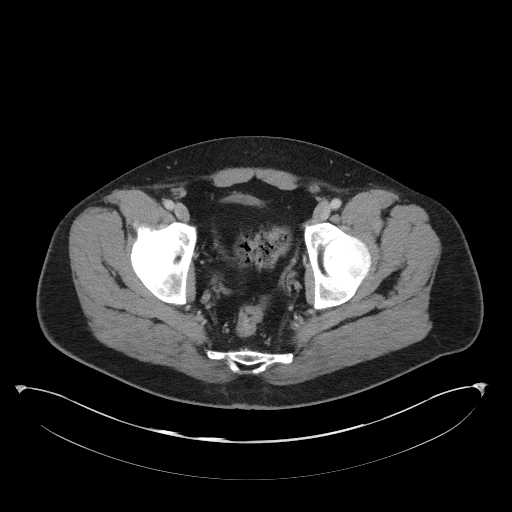
[im 29/112  soft-tissue]
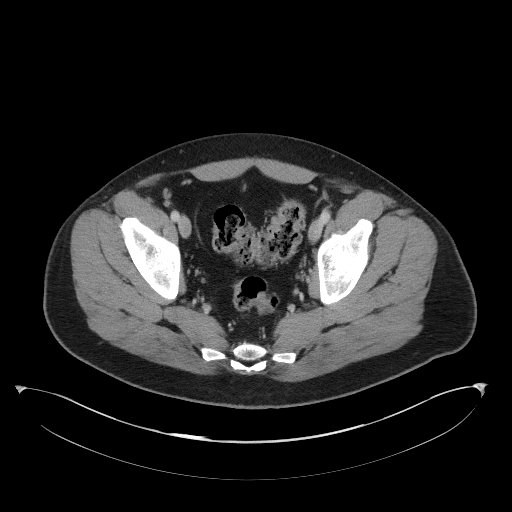
[im 39/112  soft-tissue]
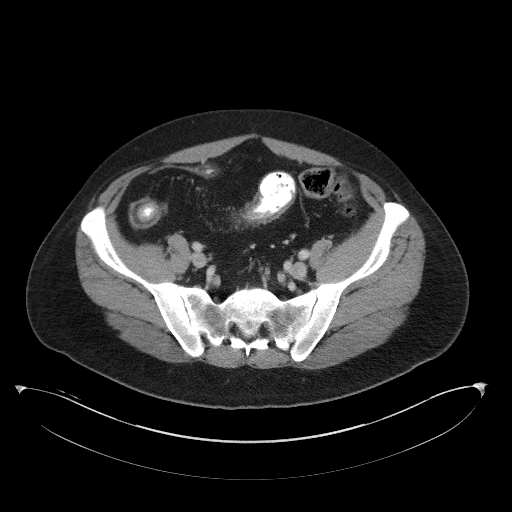
[im 49/112  soft-tissue]
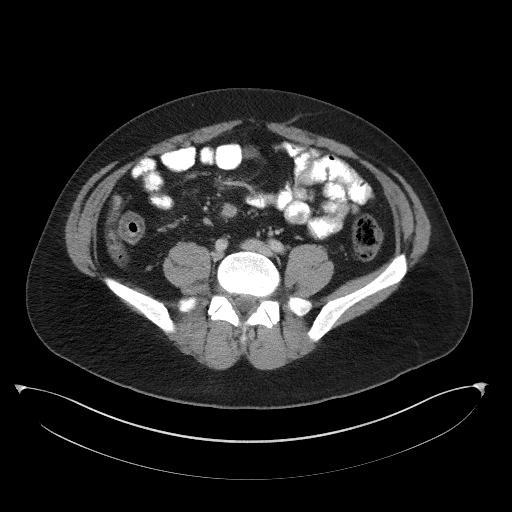
[im 58/112  soft-tissue]
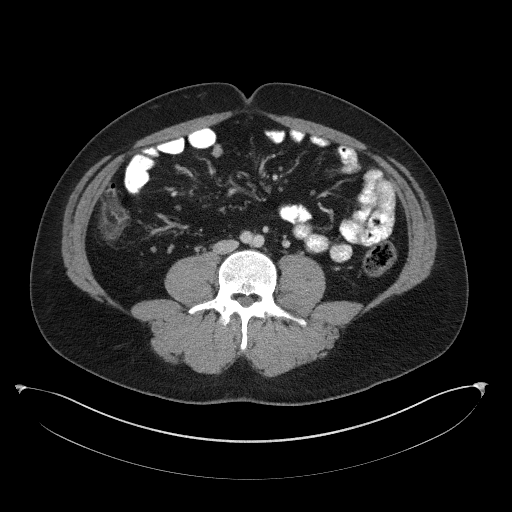
[im 63/112  soft-tissue]
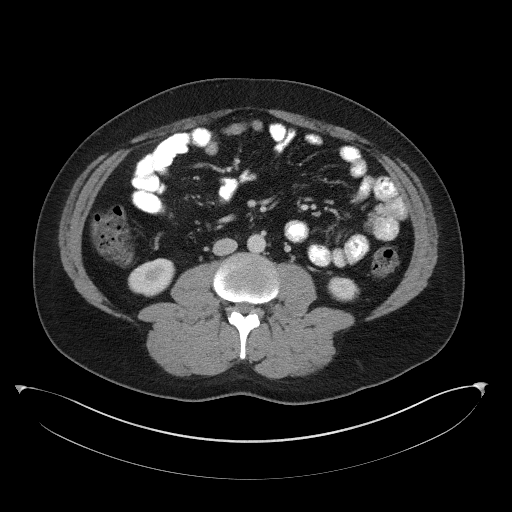
[im 73/112  soft-tissue]
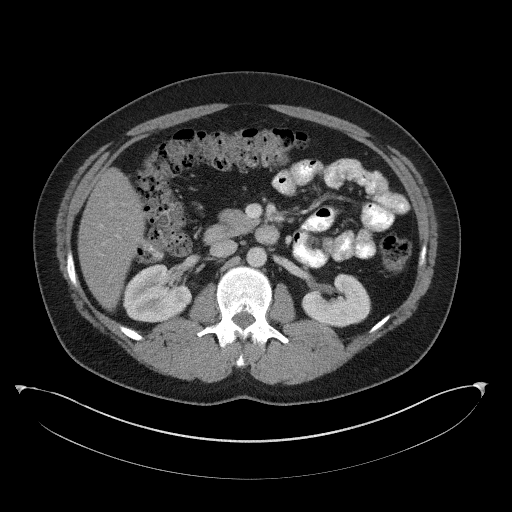
[im 73/112  bone]
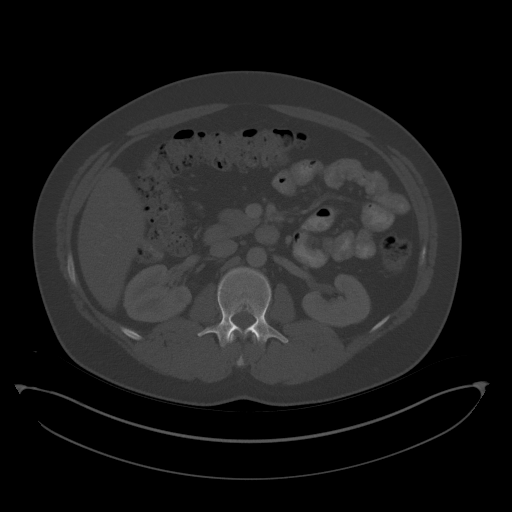
[im 83/112  soft-tissue]
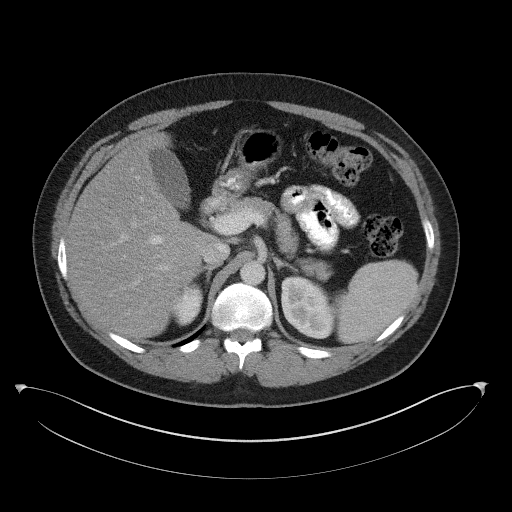
[im 87/112  soft-tissue]
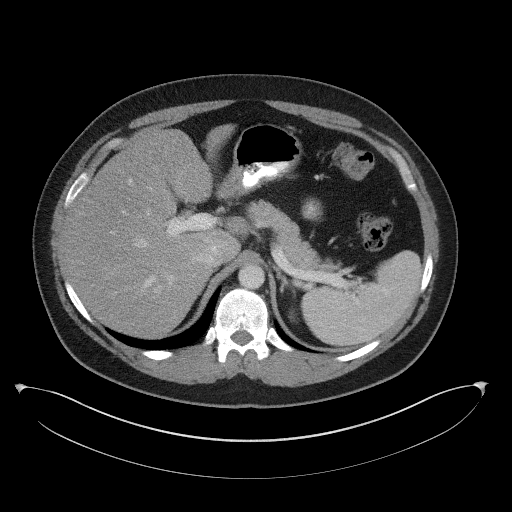
[im 92/112  lung]
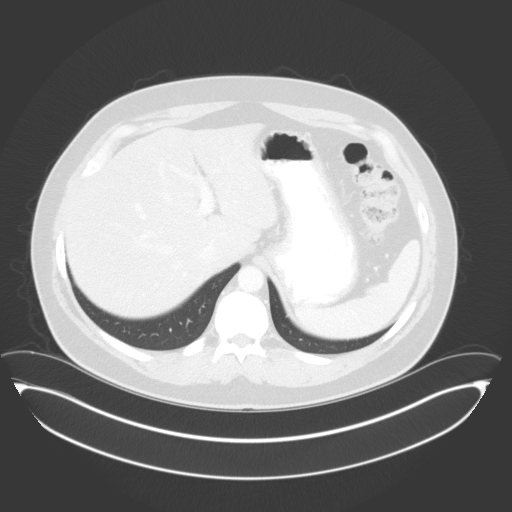
[im 97/112  soft-tissue]
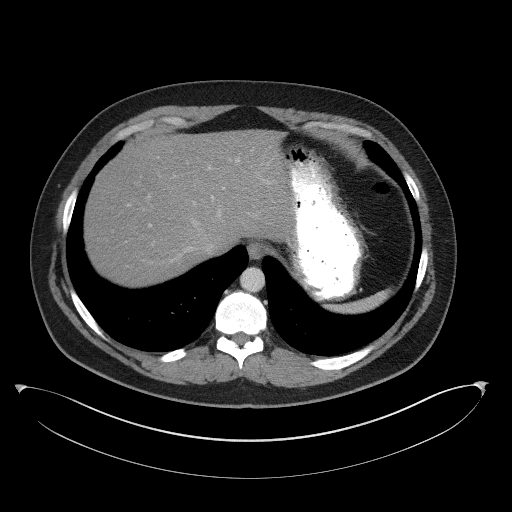
[im 97/112  lung]
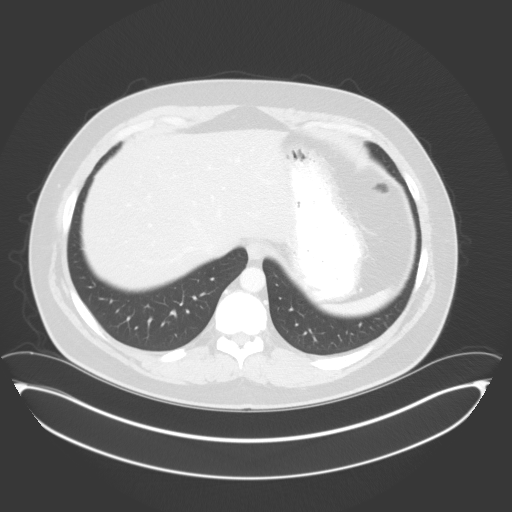
[im 102/112  lung]
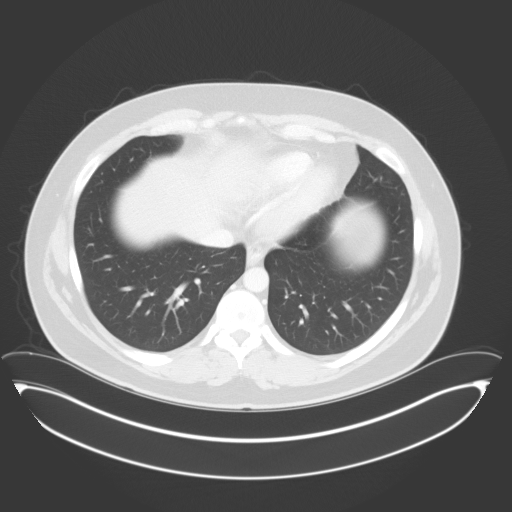
[im 107/112  soft-tissue]
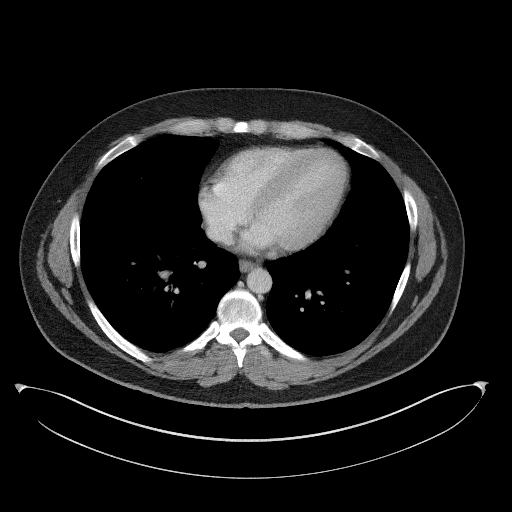
[im 107/112  lung]
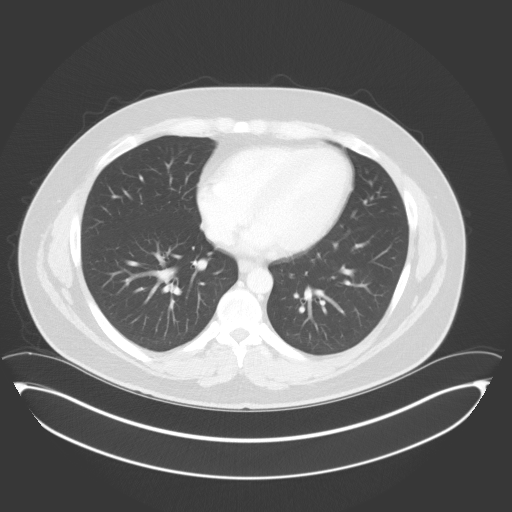

[15 of 32 positions shown; findings below may reference images not displayed]

FINDINGS: Lower chest: Lung bases are clear. No effusions. Heart is normal
size.

Hepatobiliary: Fatty infiltration of the liver. No focal abnormality
or biliary ductal dilatation. Gallbladder unremarkable.

Pancreas: No focal abnormality or ductal dilatation.

Spleen: No focal abnormality.  Normal size.

Adrenals/Urinary Tract: No adrenal abnormality. No focal renal
abnormality. No stones or hydronephrosis. Urinary bladder is
unremarkable.

Stomach/Bowel: Fatty proliferation noted in the distal and terminal
ileum, similar to prior enterography. This is likely related to old
inflammatory bowel disease. No definite active/ acute inflammation.
Appendix is normal. No evidence of bowel obstruction. Moderate stool
throughout the colon.

Vascular/Lymphatic: No evidence of aneurysm or adenopathy.

Reproductive: No visible focal abnormality.

Other: No free fluid or free air.

Musculoskeletal: No acute bony abnormality.
IMPRESSION: No acute findings in the abdomen or pelvis.

Diffuse fatty infiltration of the liver.

Fatty proliferation within the wall of the distal and terminal
ileum, likely related to old inflammatory bowel disease. No acute
changes.

## 2019-02-09 ENCOUNTER — Other Ambulatory Visit: Payer: Self-pay

## 2019-02-09 ENCOUNTER — Ambulatory Visit: Payer: 59 | Admitting: Gastroenterology

## 2019-02-09 ENCOUNTER — Other Ambulatory Visit: Payer: Self-pay | Admitting: Gastroenterology

## 2019-02-09 VITALS — Ht 72.0 in

## 2019-02-09 DIAGNOSIS — K5000 Crohn's disease of small intestine without complications: Secondary | ICD-10-CM

## 2019-02-09 NOTE — Addendum Note (Signed)
Addended by: Dorethea Clan on: 02/09/2019 02:02 PM   Modules accepted: Orders

## 2019-02-09 NOTE — Progress Notes (Signed)
Jonathon Bellows MD, MRCP(U.K) [address]  [place]  [place], [place] [address]  Main: [phone]  Fax: [phone]   Gastroenterology Consultation  Referring Provider:     Marinda Elk, MD Primary Care Physician:  Marinda Elk, MD Primary Gastroenterologist:  Dr. Jonathon Bellows  Reason for Consultation:     Chronic diarrhea        HPI:   James Haynes is a 33 y.o. y/o male referred for consultation & management  by Dr. Carrie Mew, Wilhemena Durie, MD.     He has been referred for chronic diarrhea.  He was previously a patient of Dr. Vira Agar.  Last office note in July 2019 suggest features of inflammatory bowel disease.  He had a colonoscopy in March 2017 and suggestive of diffuse mildly congested area of the terminal ileum was given a course of antibiotics.  Subsequently underwent a CT enterography which showed a long segment of terminal ileitis consistent with inflammatory bowel disease unchanged from previous CT.  Commenced on Entocort in May 2017 for 2 months.  Subsequently in June 2017 Pentasa was added.  Unclear what happened subsequently but he has not followed up.  CT abdomen and pelvis in October 2018 showed no acute findings in the abdomen or pelvis. CT enterography in April 2017 demonstrated long segment of terminal ileitis and a minimal inflammatory changes in the ascending colon.  He says that he would like to transfer care to our practice I did not get clear answers to his questions at the previous practice.  He states that for over 4 years he has been having rectal bleeding diarrhea bloating abdominal distention and discomfort.  Denies any joint pains or eye symptoms.  Denies any recent use of steroids for his GI tract.  No family history of Crohn's disease or ulcerative colitis.  He has been a tobacco smoker but recently changed to [place].  Did not notice any change in his GI symptoms since then.  Denies any weight loss.  Denies any use of NSAIDs.  Past Medical  History:  Diagnosis Date   Anxiety    Depression    HTN (hypertension)    Low testosterone     Past Surgical History:  Procedure Laterality Date   FOOT SURGERY  2012    Prior to Admission medications   Medication Sig Start Date End Date Taking? Authorizing Provider  amLODipine (NORVASC) 5 MG tablet Take by mouth. 11/26/17 11/26/18  [provider]  clomiPHENE (CLOMID) 50 MG tablet Take 0.5 tablets (25 mg total) by mouth daily. 07/12/18   Stoioff, Ronda Fairly, MD  losartan (COZAAR) 100 MG tablet Take by mouth. 12/30/17   [provider]  omeprazole (PRILOSEC) 40 MG capsule Take by mouth. 03/14/18 09/10/18  [provider]  venlafaxine XR (EFFEXOR-XR) 75 MG 24 hr capsule TAKE 3 CAPSULES BY MOUTH EVERY DAY 12/07/15   Joretta Bachelor, PA    Family History  Problem Relation Age of Onset   Prostate cancer Paternal Grandfather    Kidney cancer Neg Hx    Bladder Cancer Neg Hx      Social History   Tobacco Use   Smoking status: Former Smoker    Types: Cigarettes   Smokeless tobacco: Current User    Types: Chew  Substance Use Topics   Alcohol use: Yes    Alcohol/week: 2.0 standard drinks    Types: 2 Cans of beer per week   Drug use: No    Allergies as of 02/09/2019   (  No Known Allergies)    Review of Systems:    All systems reviewed and negative except where noted in HPI.   Physical Exam:  There were no vitals taken for this visit. No LMP for male patient. Psych:  Alert and cooperative. Normal mood and affect. General:   Alert,  Well-developed, well-nourished, pleasant and cooperative in NAD Head:  Normocephalic and atraumatic. Eyes:  Sclera clear, no icterus.   Conjunctiva pink. Ears:  Normal auditory acuity. Nose:  No deformity, discharge, or lesions. Mouth:  No deformity or lesions,oropharynx pink & moist. Neck:  Supple; no masses or thyromegaly. Lungs:  Respirations even and unlabored.  Clear throughout to auscultation.   No  wheezes, crackles, or rhonchi. No acute distress. Heart:  Regular rate and rhythm; no murmurs, clicks, rubs, or gallops. Abdomen:  Normal bowel sounds.  No bruits.  Soft, non-tender and non-distended without masses, hepatosplenomegaly or hernias noted.  No guarding or rebound tenderness.    Neurologic:  Alert and oriented x3;  grossly normal neurologically. Skin:  Intact without significant lesions or rashes. No jaundice. Lymph Nodes:  No significant cervical adenopathy. Psych:  Alert and cooperative. Normal mood and affect.  Imaging Studies: No results found.  Assessment and Plan:   James Haynes is a 33 y.o. y/o male has been referred for chronic diarrhea and possible inflammatory bowel disease.  Review of his records suggest that he possibly may have Crohn's disease although I could not obtain the report of his last colonoscopy and biopsies.  This I inferred based on the last office note of his last gastroenterologist.  Unfortunately he has not been on the right medications for Crohn's disease.  Clinically he is symptomatic.  My first step would be to delineate the extent of his Crohn's disease radiologically and then confirm endoscopically with biopsies.  After which he would need to be started on a biological therapy if this is in fact Crohn's disease.  Since he is young he will require definitive therapy with a biologic agent  otherwise he has a risk of developing strictures, fistula and subsequently will require surgery.  Plan 1.  MR enterography 2.  Colonoscopy with terminal ileal intubation after the MRI 3.  Stool studies for C. difficile and GI PCR, fecal calprotectin.   I have discussed alternative options, risks & benefits,  which include, but are not limited to, bleeding, infection, perforation,respiratory complication & drug reaction.  The patient agrees with this plan & written consent will be obtained.     Follow up in 6 weeks  Dr Jonathon Bellows MD,MRCP(U.K)

## 2019-02-10 NOTE — Progress Notes (Signed)
Needs hep A/b vaccine

## 2019-02-14 LAB — HEPATITIS B SURFACE ANTIBODY,QUALITATIVE: Hep B Surface Ab, Qual: NONREACTIVE

## 2019-02-14 LAB — QUANTIFERON-TB GOLD PLUS
QuantiFERON Mitogen Value: >10 IU/mL
QuantiFERON Nil Value: 0.01 IU/mL
QuantiFERON TB1 Ag Value: 0.03 IU/mL
QuantiFERON TB2 Ag Value: 0.01 IU/mL
QuantiFERON-TB Gold Plus: NEGATIVE

## 2019-02-14 LAB — HEPATITIS B E ANTIBODY: Hep B E Ab: NEGATIVE

## 2019-02-14 LAB — THIOPURINE METHYLTRANSFERASE (TPMT), RBC: TPMT Activity:: 31.2 Units/mL RBC

## 2019-02-14 LAB — HEPATITIS A ANTIBODY, TOTAL: hep A Total Ab: NEGATIVE

## 2019-02-14 LAB — HEPATITIS B SURFACE ANTIGEN: Hepatitis B Surface Ag: NEGATIVE

## 2019-02-14 LAB — C-REACTIVE PROTEIN: CRP: 4 mg/L (ref 0–10)

## 2019-02-14 LAB — HEPATITIS B E ANTIGEN: Hep B E Ag: NEGATIVE

## 2019-02-16 ENCOUNTER — Encounter: Payer: Self-pay | Admitting: Gastroenterology

## 2019-02-16 ENCOUNTER — Telehealth: Payer: Self-pay

## 2019-02-16 NOTE — Telephone Encounter (Signed)
-----   Message from Jonathon Bellows, MD sent at 02/10/2019 10:58 AM EDT ----- Needs hep A/b vaccine

## 2019-02-16 NOTE — Telephone Encounter (Signed)
Called pt to inform him of lab results and to inform him of MRI appointment information.   Unable to contact, LVM to return call

## 2019-02-16 NOTE — Telephone Encounter (Signed)
Spoke with James Haynes and informed him of lab results and Dr. Georgeann Oppenheim suggestion for James Haynes to receive the Hep A/B vaccine. James Haynes agrees to receive the vaccine during his follow up visit with Dr. Vicente Males on 03-01-19. I have also informed James Haynes of MRI appointment information.

## 2019-02-18 LAB — C DIFFICILE, CYTOTOXIN B

## 2019-02-18 LAB — GI PROFILE, STOOL, PCR

## 2019-02-18 LAB — CALPROTECTIN, FECAL: Calprotectin, Fecal: 231 ug/g — ABNORMAL HIGH (ref 0–120)

## 2019-02-18 LAB — C DIFFICILE TOXINS A+B W/RFLX: C difficile Toxins A+B, EIA: NEGATIVE

## 2019-02-24 ENCOUNTER — Ambulatory Visit
Admission: RE | Admit: 2019-02-24 | Discharge: 2019-02-24 | Disposition: A | Discharge status: Home / Self Care | Payer: 59 | Source: Ambulatory Visit | Attending: Gastroenterology | Admitting: Gastroenterology

## 2019-02-24 ENCOUNTER — Other Ambulatory Visit: Admission: RE | Admit: 2019-02-24 | Payer: 59 | Source: Ambulatory Visit

## 2019-02-24 ENCOUNTER — Other Ambulatory Visit: Payer: Self-pay

## 2019-02-24 DIAGNOSIS — K5000 Crohn's disease of small intestine without complications: Secondary | ICD-10-CM | POA: Insufficient documentation

## 2019-02-24 LAB — POCT I-STAT CREATININE: Creatinine, Ser: 0.9 mg/dL (ref 0.61–1.24)

## 2019-02-24 MED ORDER — GADOBUTROL 1 MMOL/ML IV SOLN
10.0000 mL | Freq: Once | INTRAVENOUS | Status: AC | PRN
  Administered 2019-02-24: 10 mL via INTRAVENOUS

## 2019-02-27 ENCOUNTER — Encounter: Payer: Self-pay | Admitting: Gastroenterology

## 2019-02-27 NOTE — Progress Notes (Signed)
Please inform patient that there is inflammation seen at the end of the small intestine which is the terminal ileum.  It is likely he has Crohn's disease.  I would suggest that we proceed with colonoscopy to take biopsies and confirm our suspicion and restart him on treatment soon.  Please go ahead and schedule a colonoscopy if agreeable

## 2019-03-01 ENCOUNTER — Ambulatory Visit (INDEPENDENT_AMBULATORY_CARE_PROVIDER_SITE_OTHER): Payer: 59 | Admitting: Gastroenterology

## 2019-03-01 DIAGNOSIS — K5000 Crohn's disease of small intestine without complications: Secondary | ICD-10-CM

## 2019-03-01 NOTE — Progress Notes (Signed)
James Haynes , MD [address]  [place]  [place], [place] [address]  Main: [phone]  Fax: [phone]   Primary Care Physician: Marinda Elk, MD  Virtual Visit via Telephone Note  I connected with patient on 03/01/19 at 10:00 AM EDT by telephone and verified that I am speaking with the correct person using two identifiers.   I discussed the limitations, risks, security and privacy concerns of performing an evaluation and management service by telephone and the availability of in person appointments. I also discussed with the patient that there may be a patient responsible charge related to this service. The patient expressed understanding and agreed to proceed.  Location of Patient: Home Location of Provider: Home Persons involved: Patient and provider only   History of Present Illness: Follow-up for possible Crohn's disease  HPI: James Haynes is a 33 y.o. male    Summary of history : He was initially seen and referred on 02/09/2019 for chronic diarrhea.  He transferred care to our practice in 02/26/2019 He was previously a patient of Dr. Vira Agar.  Last office note in July 2019 suggest features of inflammatory bowel disease.  He had a colonoscopy in March 2017 and suggestive of diffuse mildly congested area of the terminal ileum was given a course of antibiotics.  Subsequently underwent a CT enterography which showed a long segment of terminal ileitis consistent with inflammatory bowel disease unchanged from previous CT.  Commenced on Entocort in May 2017 for 2 months.  Subsequently in June 2017 Pentasa was added.  Unclear what happened subsequently but he has not followed up.   CT enterography in April 2017 demonstrated long segment of terminal ileitis and a minimal inflammatory changes in the ascending colon.   He states that for over 4 years he has been having rectal bleeding diarrhea bloating abdominal distention and discomfort.  Denies any joint pains or  eye symptoms.  Denies any recent use of steroids for his GI tract.  No family history of Crohn's disease or ulcerative colitis.  He has been a tobacco smoker but recently changed to [place].  Did not notice any change in his GI symptoms since then.  Denies any weight loss.  Denies any use of NSAIDs.  Interval history 02/09/2019-03/01/2019  02/10/2019: C. difficile toxin negative: GI PCR negative: Fecal calprotectin elevated at 231: 02/09/2019: Hepatitis A antibody negative, T PMT normal enzyme activity.  TB QuantiFERON Negative.  Hepatitis B surface antibody negative nonreactive.  HBsAg, hepatitis B E antigen, hepatitis B E antibody negative, CRP 4.  12/21/2018 HIV negative  02/24/2019: MR enterography: Active inflammation in the terminal 25 cm ileum extending to the IC valve with wall thickening, mucosal enhancement.  Skip lesion seen in the ileum.  Air-fluid level in the rectum.  4 mm gallbladder polyp or stone.  He is doing okay for now.  I discussed the results of the MRI and need to proceed with colonoscopy which would help direct therapy  Current Outpatient Medications  Medication Sig Dispense Refill   amLODipine (NORVASC) 5 MG tablet Take by mouth.     amLODipine (NORVASC) 5 MG tablet TAKE 1 TABLET BY MOUTH EVERY DAY     clomiPHENE (CLOMID) 50 MG tablet Take 0.5 tablets (25 mg total) by mouth daily. (Patient not taking: Reported on 02/09/2019) 30 tablet 0   losartan (COZAAR) 100 MG tablet Take by mouth.     losartan (COZAAR) 50 MG tablet TAKE 2 TABLETS BY MOUTH EVERY DAY (100 MG ON  BACKORDER)     omeprazole (PRILOSEC) 40 MG capsule Take by mouth.     venlafaxine XR (EFFEXOR-XR) 75 MG 24 hr capsule TAKE 3 CAPSULES BY MOUTH EVERY DAY 270 capsule 0   No current facility-administered medications for this visit.     Allergies as of 03/01/2019   (No Known Allergies)    Review of Systems:    All systems reviewed and negative except where noted in HPI.    Observations/Objective:  Labs: CMP     Component Value Date/Time   CREATININE 0.90 02/24/2019 0954   Lab Results  Component Value Date   WBC 10.6 (A) 11/13/2014   HGB 14.8 11/13/2014   HCT 44.1 11/13/2014   MCV 86.6 11/13/2014    Imaging Studies: Mr [place] Abdomen W Wo Contrast  Result Date: 02/24/2019 CLINICAL DATA:  Crohn's disease surveillance. Abdominal pain and diarrhea. EXAM: MR ABDOMEN AND PELVIS WITHOUT AND WITH CONTRAST (MR ENTEROGRAPHY) TECHNIQUE: Multiplanar, multisequence MRI of the abdomen and pelvis was performed both before and during bolus administration of intravenous contrast. Negative oral contrast VoLumen was given. CONTRAST:  56m GADAVIST GADOBUTROL 1 MMOL/ML IV SOLN COMPARISON:  CT scan 04/07/2017 FINDINGS: COMBINED FINDINGS FOR BOTH MR ABDOMEN AND PELVIS Lower chest: Not excluded Hepatobiliary: 5 mm cyst in right hepatic lobe. 4 mm filling defect in the gallbladder, probably a polyp given the positioning. Otherwise unremarkable where included. Pancreas:  Unremarkable Spleen:  Unremarkable where included. Adrenals/Urinary Tract: Both adrenal glands appear normal. Mild scarring in right kidney upper pole. 4 mm nonenhancing lesion of the right mid kidney and similar 5 mm lesion of the left kidney lower pole are compatible with cysts. Stomach/Bowel: Abnormal mucosal enhancement, mucosal irregularity, and adipose tissue deposition in the terminal 25 cm of the ileum extending to the ileocecal valve. Mild associated inflammatory stranding and hyperemia in the adjacent small bowel mesentery. There is a previous segment of relatively normal appearing ileum, subsequently a 3.8 cm skip lesion in the distal ileum with wall thickening on image 52/9. Expected duodenal appearance and configuration. Normal jejunal folds. No overtly dilated small bowel. No current colitis or additional skip lesions identified. Air fluid in the rectum favoring diarrheal process. Vascular/Lymphatic:  Scattered reactive central mesenteric and ileocolic lymph nodes. Reproductive: Unremarkable where included. The apex of prostate gland was not included due to boundary limitations of the MRI unit. Other:  No supplemental non-categorized findings. Musculoskeletal: No compelling findings of active sacroiliitis. IMPRESSION: 1. Active inflammation in the terminal 25 cm of the ileum extending to the ileocecal valve, with wall thickening, chronic adipose deposition in bowel wall, mucosal enhancement and mild mucosal irregularity, and low-level edema and hyperemia the adjacent mesentery. There is a short segment of normal ileum just proximal to this inflamed segment, and with a 3.8 cm in length skip lesion in the distal ileum with similar wall thickening as shown on image 52/9. 2. Air fluid level in the rectum compatible with diarrheal process. However, no current active inflammation of the colon wall is identified. 3. Scattered small regional reactive lymph nodes. 4. 4 mm filling defect in the gallbladder, potentially a small adherent stone or polyp. Polyps of this size are generally considered benign. Electronically Signed   By: WVan ClinesM.D.   On: 02/24/2019 11:18   Mr EKerry FortPelvis W Wo Contrast  Result Date: 02/24/2019 CLINICAL DATA:  Crohn's disease surveillance. Abdominal pain and diarrhea. EXAM: MR ABDOMEN AND PELVIS WITHOUT AND WITH CONTRAST (MR ENTEROGRAPHY) TECHNIQUE: Multiplanar, multisequence MRI of the abdomen  and pelvis was performed both before and during bolus administration of intravenous contrast. Negative oral contrast VoLumen was given. CONTRAST:  18m GADAVIST GADOBUTROL 1 MMOL/ML IV SOLN COMPARISON:  CT scan 04/07/2017 FINDINGS: COMBINED FINDINGS FOR BOTH MR ABDOMEN AND PELVIS Lower chest: Not excluded Hepatobiliary: 5 mm cyst in right hepatic lobe. 4 mm filling defect in the gallbladder, probably a polyp given the positioning. Otherwise unremarkable where included. Pancreas:   Unremarkable Spleen:  Unremarkable where included. Adrenals/Urinary Tract: Both adrenal glands appear normal. Mild scarring in right kidney upper pole. 4 mm nonenhancing lesion of the right mid kidney and similar 5 mm lesion of the left kidney lower pole are compatible with cysts. Stomach/Bowel: Abnormal mucosal enhancement, mucosal irregularity, and adipose tissue deposition in the terminal 25 cm of the ileum extending to the ileocecal valve. Mild associated inflammatory stranding and hyperemia in the adjacent small bowel mesentery. There is a previous segment of relatively normal appearing ileum, subsequently a 3.8 cm skip lesion in the distal ileum with wall thickening on image 52/9. Expected duodenal appearance and configuration. Normal jejunal folds. No overtly dilated small bowel. No current colitis or additional skip lesions identified. Air fluid in the rectum favoring diarrheal process. Vascular/Lymphatic: Scattered reactive central mesenteric and ileocolic lymph nodes. Reproductive: Unremarkable where included. The apex of prostate gland was not included due to boundary limitations of the MRI unit. Other:  No supplemental non-categorized findings. Musculoskeletal: No compelling findings of active sacroiliitis. IMPRESSION: 1. Active inflammation in the terminal 25 cm of the ileum extending to the ileocecal valve, with wall thickening, chronic adipose deposition in bowel wall, mucosal enhancement and mild mucosal irregularity, and low-level edema and hyperemia the adjacent mesentery. There is a short segment of normal ileum just proximal to this inflamed segment, and with a 3.8 cm in length skip lesion in the distal ileum with similar wall thickening as shown on image 52/9. 2. Air fluid level in the rectum compatible with diarrheal process. However, no current active inflammation of the colon wall is identified. 3. Scattered small regional reactive lymph nodes. 4. 4 mm filling defect in the gallbladder,  potentially a small adherent stone or polyp. Polyps of this size are generally considered benign. Electronically Signed   By: WVan ClinesM.D.   On: 02/24/2019 11:18    Assessment and Plan:   JMcdaniel Ohmsis a 33y.o. y/o male here to follow-up for possible Crohn's disease. Review of his records suggest that he possibly may have Crohn's disease although I could not obtain the report of his last colonoscopy and biopsies.  This I inferred based on the last office note of his last gastroenterologist.  Unfortunately he has not been on the right medications for Crohn's disease.  Clinically he is symptomatic.  MR enterography demonstrates features in the ileum suggestive of small bowel Crohn's.   lan 1.  Colonoscopy with terminal ileal intubation and biopsies.  Once diagnosis is confirmed we will proceed with commencement of biologic agent.     I discussed the assessment and treatment plan with the patient. The patient was provided an opportunity to ask questions and all were answered. The patient agreed with the plan and demonstrated an understanding of the instructions.   The patient was advised to call back or seek an in-person evaluation if the symptoms worsen or if the condition fails to improve as anticipated.  I provided 12 minutes of non-face-to-face time during this encounter.  Dr KJonathon BellowsMD,MRCP ([facility] Gastroenterology/Hepatology Pager: [phone]  Speech recognition software was used to dictate this note.

## 2019-03-02 ENCOUNTER — Other Ambulatory Visit: Payer: Self-pay

## 2019-03-02 DIAGNOSIS — K5000 Crohn's disease of small intestine without complications: Secondary | ICD-10-CM

## 2019-03-02 MED ORDER — NA SULFATE-K SULFATE-MG SULF 17.5-3.13-1.6 GM/177ML PO SOLN: 1.0000 | Freq: Once | ORAL | 0 refills | Status: AC

## 2019-03-14 ENCOUNTER — Other Ambulatory Visit: Payer: Self-pay

## 2019-03-14 ENCOUNTER — Other Ambulatory Visit
Admission: RE | Admit: 2019-03-14 | Discharge: 2019-03-14 | Disposition: A | Discharge status: Home / Self Care | Payer: 59 | Source: Ambulatory Visit | Attending: Gastroenterology | Admitting: Gastroenterology

## 2019-03-14 DIAGNOSIS — Z01812 Encounter for preprocedural laboratory examination: Secondary | ICD-10-CM | POA: Diagnosis not present

## 2019-03-14 DIAGNOSIS — Z20828 Contact with and (suspected) exposure to other viral communicable diseases: Secondary | ICD-10-CM | POA: Insufficient documentation

## 2019-03-15 LAB — SARS CORONAVIRUS 2 (TAT 6-24 HRS): SARS Coronavirus 2: NEGATIVE

## 2019-03-17 ENCOUNTER — Ambulatory Visit: Payer: 59 | Admitting: Certified Registered"

## 2019-03-17 ENCOUNTER — Other Ambulatory Visit: Payer: Self-pay

## 2019-03-17 ENCOUNTER — Encounter: Payer: Self-pay | Admitting: *Deleted

## 2019-03-17 ENCOUNTER — Encounter
Admission: RE | Disposition: A | Discharge status: Home / Self Care | Payer: Self-pay | Source: Home / Self Care | Attending: Gastroenterology

## 2019-03-17 ENCOUNTER — Ambulatory Visit
Admission: RE | Admit: 2019-03-17 | Discharge: 2019-03-17 | Disposition: A | Discharge status: Home / Self Care | Payer: 59 | Attending: Gastroenterology | Admitting: Gastroenterology

## 2019-03-17 DIAGNOSIS — Z87891 Personal history of nicotine dependence: Secondary | ICD-10-CM | POA: Diagnosis not present

## 2019-03-17 DIAGNOSIS — K5000 Crohn's disease of small intestine without complications: Secondary | ICD-10-CM

## 2019-03-17 DIAGNOSIS — K5090 Crohn's disease, unspecified, without complications: Secondary | ICD-10-CM | POA: Diagnosis present

## 2019-03-17 DIAGNOSIS — Z79899 Other long term (current) drug therapy: Secondary | ICD-10-CM | POA: Diagnosis not present

## 2019-03-17 DIAGNOSIS — K56699 Other intestinal obstruction unspecified as to partial versus complete obstruction: Secondary | ICD-10-CM | POA: Diagnosis not present

## 2019-03-17 DIAGNOSIS — I10 Essential (primary) hypertension: Secondary | ICD-10-CM | POA: Diagnosis not present

## 2019-03-17 DIAGNOSIS — K529 Noninfective gastroenteritis and colitis, unspecified: Secondary | ICD-10-CM | POA: Diagnosis not present

## 2019-03-17 DIAGNOSIS — F419 Anxiety disorder, unspecified: Secondary | ICD-10-CM | POA: Diagnosis not present

## 2019-03-17 DIAGNOSIS — F319 Bipolar disorder, unspecified: Secondary | ICD-10-CM | POA: Diagnosis not present

## 2019-03-17 HISTORY — PX: COLONOSCOPY WITH PROPOFOL: SHX5780

## 2019-03-17 SURGERY — COLONOSCOPY WITH PROPOFOL
Anesthesia: General

## 2019-03-17 MED ORDER — PROPOFOL 500 MG/50ML IV EMUL
INTRAVENOUS | Status: DC | PRN
  Administered 2019-03-17: 150 ug/kg/min via INTRAVENOUS

## 2019-03-17 MED ORDER — PROPOFOL 10 MG/ML IV BOLUS
INTRAVENOUS | Status: DC | PRN
  Administered 2019-03-17: 70 mg via INTRAVENOUS

## 2019-03-17 MED ORDER — PROPOFOL 10 MG/ML IV BOLUS
INTRAVENOUS | Status: AC
  Filled 2019-03-17: qty 40

## 2019-03-17 MED ORDER — SODIUM CHLORIDE 0.9 % IV SOLN
INTRAVENOUS | Status: DC
  Administered 2019-03-17: 1000 mL via INTRAVENOUS

## 2019-03-17 NOTE — Anesthesia Postprocedure Evaluation (Signed)
Anesthesia Post Note  Patient: James Haynes  Procedure(s) Performed: COLONOSCOPY WITH PROPOFOL (N/A )  Patient location during evaluation: Endoscopy Anesthesia Type: General Level of consciousness: awake and alert Pain management: pain level controlled Vital Signs Assessment: post-procedure vital signs reviewed and stable Respiratory status: spontaneous breathing, nonlabored ventilation, respiratory function stable and patient connected to nasal cannula oxygen Cardiovascular status: blood pressure returned to baseline and stable Postop Assessment: no apparent nausea or vomiting Anesthetic complications: no     Last Vitals:  Vitals:   03/17/19 1034 03/17/19 1044  BP: 132/86 (!) 131/92  Pulse: 72 61  Resp: 17 15  Temp:    SpO2: 98% 98%    Last Pain:  Vitals:   03/17/19 1044  TempSrc:   PainSc: Asleep                 Martha Clan

## 2019-03-17 NOTE — H&P (Signed)
James Bellows, MD [address], [place], [place], [place], [address] [address], [place], [place], [place], [address] Phone: [phone]  Fax: [phone]  Primary Care Physician:  Marinda Elk, MD   Pre-Procedure History & Physical: HPI:  James Haynes is a 32 y.o. male is here for an colonoscopy.   Past Medical History:  Diagnosis Date  . Anxiety   . Depression   . HTN (hypertension)   . Low testosterone     Past Surgical History:  Procedure Laterality Date  . FOOT SURGERY  2012    Prior to Admission medications   Medication Sig Start Date End Date Taking? Authorizing Provider  amLODipine (NORVASC) 5 MG tablet TAKE 1 TABLET BY MOUTH EVERY DAY 08/10/18  Yes [provider]  losartan (COZAAR) 100 MG tablet Take by mouth. 12/30/17  Yes [provider]  venlafaxine XR (EFFEXOR-XR) 75 MG 24 hr capsule TAKE 3 CAPSULES BY MOUTH EVERY DAY 12/07/15  Yes English, Stephanie D, PA  amLODipine (NORVASC) 5 MG tablet Take by mouth. 11/26/17 11/26/18  [provider]  clomiPHENE (CLOMID) 50 MG tablet Take 0.5 tablets (25 mg total) by mouth daily. Patient not taking: Reported on 02/09/2019 07/12/18   Abbie Sons, MD  losartan (COZAAR) 50 MG tablet TAKE 2 TABLETS BY MOUTH EVERY DAY (100 MG ON BACKORDER) 01/31/19   [provider]  omeprazole (PRILOSEC) 40 MG capsule Take by mouth. 03/14/18 09/10/18  [provider]    Allergies as of 03/02/2019  . (No Known Allergies)    Family History  Problem Relation Age of Onset  . Prostate cancer Paternal Grandfather   . Kidney cancer Neg Hx   . Bladder Cancer Neg Hx     Social History   Socioeconomic History  . Marital status: Married    Spouse name: Not on file  . Number of children: Not on file  . Years of education: Not on file  . Highest education level: Not on file  Occupational History  . Not on file  Social Needs  . Financial resource strain: Not on file  . Food insecurity     Worry: Not on file    Inability: Not on file  . Transportation needs    Medical: Not on file    Non-medical: Not on file  Tobacco Use  . Smoking status: Former Smoker    Types: Cigarettes  . Smokeless tobacco: Former Systems developer    Types: Chew  Substance and Sexual Activity  . Alcohol use: Yes    Alcohol/week: 2.0 standard drinks    Types: 2 Cans of beer per week  . Drug use: No  . Sexual activity: Yes  Lifestyle  . Physical activity    Days per week: Not on file    Minutes per session: Not on file  . Stress: Not on file  Relationships  . Social Herbalist on phone: Not on file    Gets together: Not on file    Attends religious service: Not on file    Active member of club or organization: Not on file    Attends meetings of clubs or organizations: Not on file    Relationship status: Not on file  . Intimate partner violence    Fear of current or ex partner: Not on file    Emotionally abused: Not on file    Physically abused: Not on file    Forced sexual activity: Not on file  Other Topics Concern  . Not on file  Social History Narrative  . Not on file    Review of Systems: See HPI, otherwise negative ROS  Physical Exam: BP (!) 165/123   Pulse 71   Temp (!) 96 F (35.6 C) (Tympanic)   Resp 15   Ht 6' (1.829 m)   Wt 113.4 kg   SpO2 99%   BMI 33.91 kg/m  General:   Alert,  pleasant and cooperative in NAD Head:  Normocephalic and atraumatic. Neck:  Supple; no masses or thyromegaly. Lungs:  Clear throughout to auscultation, normal respiratory effort.    Heart:  +S1, +S2, Regular rate and rhythm, No edema. Abdomen:  Soft, nontender and nondistended. Normal bowel sounds, without guarding, and without rebound.   Neurologic:  Alert and  oriented x4;  grossly normal neurologically.  Impression/Plan: James Haynes is here for an colonoscopy to be performed for possible crohns disease . Risks, benefits, limitations, and alternatives regarding  colonoscopy have  been reviewed with the patient.  Questions have been answered.  All parties agreeable.   James Bellows, MD  03/17/2019, 9:35 AM

## 2019-03-17 NOTE — Transfer of Care (Signed)
Immediate Anesthesia Transfer of Care Note  Patient: James Haynes  Procedure(s) Performed: COLONOSCOPY WITH PROPOFOL (N/A )  Patient Location: Endoscopy Unit  Anesthesia Type:General  Level of Consciousness: awake, alert  and oriented  Airway & Oxygen Therapy: Patient Spontanous Breathing and Patient connected to nasal cannula oxygen  Post-op Assessment: Report given to RN and Post -op Vital signs reviewed and stable  Post vital signs: Reviewed and stable  Last Vitals:  Vitals Value Taken Time  BP    Temp    Pulse    Resp    SpO2      Last Pain:  Vitals:   03/17/19 0929  TempSrc: Tympanic  PainSc: 0-No pain         Complications: No apparent anesthesia complications

## 2019-03-17 NOTE — Anesthesia Post-op Follow-up Note (Signed)
Anesthesia QCDR form completed.        

## 2019-03-17 NOTE — Anesthesia Preprocedure Evaluation (Signed)
Anesthesia Evaluation  Patient identified by MRN, date of birth, ID band Patient awake    Reviewed: Allergy & Precautions, H&P , NPO status , Patient's Chart, lab work & pertinent test results, reviewed documented beta blocker date and time   Airway Mallampati: II  TM Distance: >3 FB Neck ROM: full    Dental no notable dental hx.    Pulmonary neg pulmonary ROS, former smoker,    Pulmonary exam normal        Cardiovascular Exercise Tolerance: Good hypertension, (-) angina(-) Past MI and (-) Cardiac Stents Normal cardiovascular exam(-) dysrhythmias (-) Valvular Problems/Murmurs     Neuro/Psych PSYCHIATRIC DISORDERS Anxiety Depression Bipolar Disorder negative neurological ROS     GI/Hepatic negative GI ROS, Neg liver ROS,   Endo/Other  negative endocrine ROS  Renal/GU negative Renal ROS  negative genitourinary   Musculoskeletal   Abdominal   Peds  Hematology negative hematology ROS (+)   Anesthesia Other Findings Past Medical History: No date: Anxiety No date: Depression No date: HTN (hypertension) No date: Low testosterone   Reproductive/Obstetrics negative OB ROS                             Anesthesia Physical Anesthesia Plan  ASA: II  Anesthesia Plan: General   Post-op Pain Management:    Induction: Intravenous  PONV Risk Score and Plan: 2 and Propofol infusion and TIVA  Airway Management Planned: Natural Airway and Nasal Cannula  Additional Equipment:   Intra-op Plan:   Post-operative Plan:   Informed Consent: I have reviewed the patients History and Physical, chart, labs and discussed the procedure including the risks, benefits and alternatives for the proposed anesthesia with the patient or authorized representative who has indicated his/her understanding and acceptance.     Dental Advisory Given  Plan Discussed with: Anesthesiologist, CRNA and  Surgeon  Anesthesia Plan Comments:         Anesthesia Quick Evaluation

## 2019-03-17 NOTE — Op Note (Signed)
[facility] Gastroenterology Patient Name: James Haynes Procedure Date: 03/17/2019 9:36 AM MRN: [id number] Account #: [id number] Date of Birth: 07-Aug-1985 Admit Type: Outpatient Age: 33 Room: [facility] ENDO ROOM 1 Gender: Male Note Status: Finalized Procedure:            Colonoscopy Indications:          Personal history of Crohn's disease Providers:            Jonathon Bellows MD, MD Referring MD:         Precious Bard, MD (Referring MD) Medicines:            Monitored Anesthesia Care Complications:        No immediate complications. Procedure:            Pre-Anesthesia Assessment:                       - Prior to the procedure, a History and Physical was                        performed, and patient medications, allergies and                        sensitivities were reviewed. The patient's tolerance of                        previous anesthesia was reviewed.                       - The risks and benefits of the procedure and the                        sedation options and risks were discussed with the                        patient. All questions were answered and informed                        consent was obtained.                       - ASA Grade Assessment: II - A patient with mild                        systemic disease.                       After obtaining informed consent, the colonoscope was                        passed under direct vision. Throughout the procedure,                        the patient's blood pressure, pulse, and oxygen                        saturations were monitored continuously. The                        Colonoscope was introduced through the anus and  advanced to the the cecum, identified by the                        appendiceal orifice. The colonoscopy was performed with                        ease. The patient tolerated the procedure well. The                        quality of the bowel preparation was  excellent. Findings:      The perianal and digital rectal examinations were normal.      A benign-appearing, intrinsic severe stenosis measuring 8 mm (inner       diameter) was found at the ileocecal valve and was non-traversed.       Biopsies were taken with a cold forceps for histology.      The entire examined colon appeared normal on direct and retroflexion       views. Impression:           - Stricture at the ileocecal valve. Biopsied.                       - The entire examined colon is normal on direct and                        retroflexion views. Recommendation:       - Discharge patient to home (with escort).                       - Resume previous diet.                       - Continue present medications.                       - Await pathology results.                       - Return to my office in 1 week. Procedure Code(s):    --- Professional ---                       [phone], Colonoscopy, flexible; with biopsy, single or                        multiple Diagnosis Code(s):    --- Professional ---                       [phone], Other intestinal obstruction unspecified as to                        partial versus complete obstruction                       Z87.19, Personal history of other diseases of the                        digestive system CPT copyright 2019 American Medical Association. All rights reserved. The codes documented in this report are preliminary and upon coder review may  be revised to meet current compliance requirements. Jonathon Bellows, MD Jonathon Bellows MD, MD 03/17/2019 10:19:48 AM This report  has been signed electronically. Number of Addenda: 0 Note Initiated On: 03/17/2019 9:36 AM Scope Withdrawal Time: 0 hours 18 minutes 4 seconds  Total Procedure Duration: 0 hours 20 minutes 11 seconds  Estimated Blood Loss: Estimated blood loss: none.      [facility]

## 2019-03-20 ENCOUNTER — Encounter: Payer: Self-pay | Admitting: Gastroenterology

## 2019-03-21 LAB — SURGICAL PATHOLOGY

## 2019-03-22 ENCOUNTER — Other Ambulatory Visit: Payer: Self-pay

## 2019-03-22 ENCOUNTER — Ambulatory Visit: Payer: 59 | Admitting: Gastroenterology

## 2019-03-22 VITALS — BP 141/95 | HR 74 | Temp 98.3°F | Ht 72.0 in | Wt 261.8 lb

## 2019-03-22 DIAGNOSIS — K50018 Crohn's disease of small intestine with other complication: Secondary | ICD-10-CM | POA: Diagnosis not present

## 2019-03-22 DIAGNOSIS — Z23 Encounter for immunization: Secondary | ICD-10-CM

## 2019-03-22 MED ORDER — MESALAMINE ER 500 MG PO CPCR: 500.0000 mg | ORAL_CAPSULE | Freq: Four times a day (QID) | ORAL | 0 refills | Status: DC

## 2019-03-22 NOTE — Progress Notes (Signed)
James Bellows MD, MRCP(U.K) [address]  [place]  [place], [place] [address]  Main: [phone]  Fax: [phone]   Primary Care Physician: Marinda Elk, MD  Primary Gastroenterologist:  Dr. Jonathon Haynes   Follow-up after colonoscopy for Crohn's disease  HPI: James Haynes is a 33 y.o. male   Summary of history :  He was initially referred and seen in 02/26/2019 for chronic diarrhea and [facility] to myself.  He was previously been seen by Dr. Vira Agar who has diagnosed him with inflammatory bowel disease.Marland Kitchen He had a colonoscopy in March 2017 and suggestive of diffuse mildly congested area of the terminal ileum was given a course of antibiotics. Subsequently underwent a CT enterography which showed a long segment of terminal ileitis consistent with inflammatory bowel disease unchanged from previous CT. Commenced on Entocort in May 2017 for 2 months. Subsequently in June 2017 Pentasa was added. Unclear what happened subsequently but he has not followed up.   CTenterographyin April 2017 demonstrated long segment of terminal ileitis and a minimal inflammatory changes in the ascending colon.  He states that for over 4 years he has been having rectal bleeding diarrhea bloating abdominal distention and discomfort. Denies any joint pains or eye symptoms. Denies any recent use of steroids for his GI tract. No family history of Crohn's disease or ulcerative colitis. He has been a tobacco smoker but recently changed to [place]. Did not notice any change in his GI symptoms since then. Denies any weight loss. Denies any use of NSAIDs. 02/10/2019: C. difficile toxin negative: GI PCR negative: Fecal calprotectin elevated at 231: 02/09/2019: Hepatitis A antibody negative, T PMT normal enzyme activity.  TB QuantiFERON Negative.  Hepatitis B surface antibody negative nonreactive.  HBsAg, hepatitis B E antigen, hepatitis B E antibody negative, CRP  4.  12/21/2018 HIV negative  02/24/2019: MR enterography: Active inflammation in the terminal 25 cm ileum extending to the IC valve with wall thickening, mucosal enhancement.  Skip lesion seen in the ileum.  Air-fluid level in the rectum.  4 mm gallbladder polyp or stone.   Interval history 03/01/2019-03/22/2019  03/17/2019: The colonoscopy: Colonic mucosa appeared normal but there was a tight stricture at the ileocecal valve through which I could not pass the colonoscopy.  It was probably 8 mm in diameter.  I could not see through the lumen.  Biopsies were taken.Biopsies of the terminal ileum showed mild chronic active enteritis  He is not on any treatment for his Crohn's disease and has complaints of bloating abdominal distention difficulty passing gas ongoing for over 4 years.  He denies any NSAID use but he does admit to vaping.  Current Outpatient Medications  Medication Sig Dispense Refill   amLODipine (NORVASC) 5 MG tablet Take by mouth.     amLODipine (NORVASC) 5 MG tablet TAKE 1 TABLET BY MOUTH EVERY DAY     clomiPHENE (CLOMID) 50 MG tablet Take 0.5 tablets (25 mg total) by mouth daily. (Patient not taking: Reported on 02/09/2019) 30 tablet 0   losartan (COZAAR) 100 MG tablet Take by mouth.     losartan (COZAAR) 50 MG tablet TAKE 2 TABLETS BY MOUTH EVERY DAY (100 MG ON BACKORDER)     omeprazole (PRILOSEC) 40 MG capsule Take by mouth.     venlafaxine XR (EFFEXOR-XR) 75 MG 24 hr capsule TAKE 3 CAPSULES BY MOUTH EVERY DAY 270 capsule 0   No current facility-administered medications for this visit.  Allergies as of 03/22/2019   (No Known Allergies)    ROS:  General: Negative for anorexia, weight loss, fever, chills, fatigue, weakness. ENT: Negative for hoarseness, difficulty swallowing , nasal congestion. CV: Negative for chest pain, angina, palpitations, dyspnea on exertion, peripheral edema.  Respiratory: Negative for dyspnea at rest, dyspnea on exertion, cough,  sputum, wheezing.  GI: See history of present illness. GU:  Negative for dysuria, hematuria, urinary incontinence, urinary frequency, nocturnal urination.  Endo: Negative for unusual weight change.    Physical Examination:   There were no vitals taken for this visit.  General: Well-nourished, well-developed in no acute distress.  Eyes: No icterus. Conjunctivae pink. Mouth: Oropharyngeal mucosa moist and pink , no lesions erythema or exudate. Lungs: Clear to auscultation bilaterally. Non-labored. Heart: Regular rate and rhythm, no murmurs rubs or gallops.  Abdomen: Bowel sounds are normal, nontender, nondistended, no hepatosplenomegaly or masses, no abdominal bruits or hernia , no rebound or guarding.   Extremities: No lower extremity edema. No clubbing or deformities. Neuro: Alert and oriented x 3.  Grossly intact. Skin: Warm and dry, no jaundice.   Psych: Alert and cooperative, normal mood and affect.   Imaging Studies: Mr James Haynes Abdomen W Wo Contrast  Result Date: 02/24/2019 CLINICAL DATA:  Crohn's disease surveillance. Abdominal pain and diarrhea. EXAM: MR ABDOMEN AND PELVIS WITHOUT AND WITH CONTRAST (MR ENTEROGRAPHY) TECHNIQUE: Multiplanar, multisequence MRI of the abdomen and pelvis was performed both before and during bolus administration of intravenous contrast. Negative oral contrast VoLumen was given. CONTRAST:  43m GADAVIST GADOBUTROL 1 MMOL/ML IV SOLN COMPARISON:  CT scan 04/07/2017 FINDINGS: COMBINED FINDINGS FOR BOTH MR ABDOMEN AND PELVIS Lower chest: Not excluded Hepatobiliary: 5 mm cyst in right hepatic lobe. 4 mm filling defect in the gallbladder, probably a polyp given the positioning. Otherwise unremarkable where included. Pancreas:  Unremarkable Spleen:  Unremarkable where included. Adrenals/Urinary Tract: Both adrenal glands appear normal. Mild scarring in right kidney upper pole. 4 mm nonenhancing lesion of the right mid kidney and similar 5 mm lesion of the left kidney  lower pole are compatible with cysts. Stomach/Bowel: Abnormal mucosal enhancement, mucosal irregularity, and adipose tissue deposition in the terminal 25 cm of the ileum extending to the ileocecal valve. Mild associated inflammatory stranding and hyperemia in the adjacent small bowel mesentery. There is a previous segment of relatively normal appearing ileum, subsequently a 3.8 cm skip lesion in the distal ileum with wall thickening on image 52/9. Expected duodenal appearance and configuration. Normal jejunal folds. No overtly dilated small bowel. No current colitis or additional skip lesions identified. Air fluid in the rectum favoring diarrheal process. Vascular/Lymphatic: Scattered reactive central mesenteric and ileocolic lymph nodes. Reproductive: Unremarkable where included. The apex of prostate gland was not included due to boundary limitations of the MRI unit. Other:  No supplemental non-categorized findings. Musculoskeletal: No compelling findings of active sacroiliitis. IMPRESSION: 1. Active inflammation in the terminal 25 cm of the ileum extending to the ileocecal valve, with wall thickening, chronic adipose deposition in bowel wall, mucosal enhancement and mild mucosal irregularity, and low-level edema and hyperemia the adjacent mesentery. There is a short segment of normal ileum just proximal to this inflamed segment, and with a 3.8 cm in length skip lesion in the distal ileum with similar wall thickening as shown on image 52/9. 2. Air fluid level in the rectum compatible with diarrheal process. However, no current active inflammation of the colon wall is identified. 3. Scattered small regional reactive lymph nodes. 4. 4 mm  filling defect in the gallbladder, potentially a small adherent stone or polyp. Polyps of this size are generally considered benign. Electronically Signed   By: James Haynes M.D.   On: 02/24/2019 11:18   Mr James Haynes Pelvis W Wo Contrast  Result Date: 02/24/2019 CLINICAL DATA:   Crohn's disease surveillance. Abdominal pain and diarrhea. EXAM: MR ABDOMEN AND PELVIS WITHOUT AND WITH CONTRAST (MR ENTEROGRAPHY) TECHNIQUE: Multiplanar, multisequence MRI of the abdomen and pelvis was performed both before and during bolus administration of intravenous contrast. Negative oral contrast VoLumen was given. CONTRAST:  62m GADAVIST GADOBUTROL 1 MMOL/ML IV SOLN COMPARISON:  CT scan 04/07/2017 FINDINGS: COMBINED FINDINGS FOR BOTH MR ABDOMEN AND PELVIS Lower chest: Not excluded Hepatobiliary: 5 mm cyst in right hepatic lobe. 4 mm filling defect in the gallbladder, probably a polyp given the positioning. Otherwise unremarkable where included. Pancreas:  Unremarkable Spleen:  Unremarkable where included. Adrenals/Urinary Tract: Both adrenal glands appear normal. Mild scarring in right kidney upper pole. 4 mm nonenhancing lesion of the right mid kidney and similar 5 mm lesion of the left kidney lower pole are compatible with cysts. Stomach/Bowel: Abnormal mucosal enhancement, mucosal irregularity, and adipose tissue deposition in the terminal 25 cm of the ileum extending to the ileocecal valve. Mild associated inflammatory stranding and hyperemia in the adjacent small bowel mesentery. There is a previous segment of relatively normal appearing ileum, subsequently a 3.8 cm skip lesion in the distal ileum with wall thickening on image 52/9. Expected duodenal appearance and configuration. Normal jejunal folds. No overtly dilated small bowel. No current colitis or additional skip lesions identified. Air fluid in the rectum favoring diarrheal process. Vascular/Lymphatic: Scattered reactive central mesenteric and ileocolic lymph nodes. Reproductive: Unremarkable where included. The apex of prostate gland was not included due to boundary limitations of the MRI unit. Other:  No supplemental non-categorized findings. Musculoskeletal: No compelling findings of active sacroiliitis. IMPRESSION: 1. Active inflammation  in the terminal 25 cm of the ileum extending to the ileocecal valve, with wall thickening, chronic adipose deposition in bowel wall, mucosal enhancement and mild mucosal irregularity, and low-level edema and hyperemia the adjacent mesentery. There is a short segment of normal ileum just proximal to this inflamed segment, and with a 3.8 cm in length skip lesion in the distal ileum with similar wall thickening as shown on image 52/9. 2. Air fluid level in the rectum compatible with diarrheal process. However, no current active inflammation of the colon wall is identified. 3. Scattered small regional reactive lymph nodes. 4. 4 mm filling defect in the gallbladder, potentially a small adherent stone or polyp. Polyps of this size are generally considered benign. Electronically Signed   By: WVan ClinesM.D.   On: 02/24/2019 11:18    Assessment and Plan:   James Terrienis a 33y.o. y/o male  here to follow-up for possible Crohn's disease.Review of his records suggest that he possibly may have Crohn's disease although I could not obtain the report of his last colonoscopy and biopsies from 2017. This I inferred based on the last office note of his last gastroenterologist. Unfortunately he has not been on the right medications for Crohn's disease. Clinically he is symptomatic.  MR enterography demonstrates features in the ileum suggestive of small bowel Crohn's.  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  I did manage to take some biopsies which shows features of chronic active enteritis.  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  I do not think this would be amenable to medical treatment.  Fecal calprotectin is 231 suggesting ongoing mucosal inflammation of the GI tract   1.  Refer to Dr. Dema Severin in [place] to determine if he that he requires surgery to treat the stricture versus  stricturoplasty or dilation.  Following surgery probably will start him on a biologic agent since this is been going on for a long time.  I would also consider to perform a colonoscopy in a few months after surgery if he undergoes the same.  2.  No NSAID use.  Commence on Pentasa.  3.  Stop vaping as tobacco use can increase the severity of Crohn's disease.   Dr James Bellows  MD,MRCP [facility]) Follow up in 6 to 8 weeks

## 2019-03-23 ENCOUNTER — Encounter: Payer: Self-pay | Admitting: Gastroenterology

## 2019-04-06 ENCOUNTER — Ambulatory Visit: Payer: 59 | Admitting: Gastroenterology

## 2019-04-11 ENCOUNTER — Other Ambulatory Visit: Payer: Self-pay | Admitting: Gastroenterology

## 2019-04-11 NOTE — Telephone Encounter (Signed)
Last refill 03/22/2019 0 refills  Last office visit 03/22/2019 Crohn's disease  Has appointment 05/09/2019

## 2019-04-12 ENCOUNTER — Telehealth: Payer: Self-pay

## 2019-04-12 NOTE — Telephone Encounter (Signed)
Spoke with pt and was able to schedule virtual visit for 04-13-19.

## 2019-04-12 NOTE — Telephone Encounter (Signed)
-----   Message from Jonathon Bellows, MD sent at 04/11/2019  1:22 PM EST ----- Regarding: RE: Follow-up Good afternoonThank you so much for the update and hope you are staying safe.  I would be absolutely happy to commence him on a biologic to see if he responds.  We have nothing to loose.  Since he is not having severe symptoms at this point I do not believe he needs any steroids.  I will bring him into my office  and discuss with him the various options for Biologics and start him on one ASAP.  Potentially you could follow him up with a repeat MRE at some point to see what response we have got.  I agree if it helps him get a bit longer from not going into surgery, it would definitely be a good option.  Daleyssa Loiselle : Offer office visit or virtual visit to discuss next steps   RegardsKiran ----- Message ----- From: Ileana Roup, MD Sent: 04/11/2019  12:33 PM EST To: Jonathon Bellows, MD Subject: Follow-up                                      Hey Dr. Vicente Males - I hope y'all are doing ok among the chaotic world we now reside...! I saw Mr. Rodrigue today in the office - thank you for sending him our way. I wanted to touch base with you about everything. He is doing pretty well at this point. He has some intermittent bloating at times but doesn't appear to have crescendoing "obstructive" type symptoms and has actually reported issues with regards to weight gain over the last 3-6 months. I was able to review your records as well as the [facility] notes from [place] previously. His MRE showed active inflammation in the 25 cm of the ileum extending to the ileocecal valve with a short skip area in the distal ileum as well, likely all encompasing ~30-35 cm of ileum. It doesn't appear he has been trialed thus far on biologics and I was curious to hear your thoughts. Given its location and length, I don't think he would be a candidate for any type of surgery short of an ileocolic resection. Since he is maintaining weight,  (gaining), eating and so far seems to be getting along fine, would you consider trial of biologics? It looks like there is certainly an active component on his imaging that would hopefully respond. I don't think it would burn any bridges at this point either... chronic steroids are more of what gives [place] surgeons pause in regards to postoperative complications/leak/sepsis. I'm willing to bet at 33yo, surgery is likely happen at some point in his future, but would ideally get as much mileage as we can out of what he has. I have him scheduled to see me back in 2-3 months as well to rehash and recheck everything  Gerald Stabs

## 2019-04-13 ENCOUNTER — Telehealth: Payer: Self-pay | Admitting: Gastroenterology

## 2019-04-13 ENCOUNTER — Ambulatory Visit (INDEPENDENT_AMBULATORY_CARE_PROVIDER_SITE_OTHER): Payer: 59 | Admitting: Gastroenterology

## 2019-04-13 DIAGNOSIS — K50018 Crohn's disease of small intestine with other complication: Secondary | ICD-10-CM

## 2019-04-13 NOTE — Progress Notes (Signed)
James Haynes , MD [address]  [place]  [place], [place] [address]  Main: [phone]  Fax: [phone]   Primary Care Physician: Marinda Elk, MD  Virtual Visit via Video Note  I connected with patient on 04/13/19 at 11:00 AM EST by video and verified that I am speaking with the correct person using two identifiers.   I discussed the limitations, risks, security and privacy concerns of performing an evaluation and management service by video  and the availability of in person appointments. I also discussed with the patient that there may be a patient responsible charge related to this service. The patient expressed understanding and agreed to proceed.  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 male   Summary of history :  He was initially referred and seen in 02/26/2019 for chronic diarrhea and [facility] care from [facility] to myself.  He was previously been seen by Dr. Vira Agar who has diagnosed him with inflammatory bowel disease: IBD-crohns disease of the small bowel in 2017  .Commenced on Entocort in May 2017 for 2 months. Subsequently in June 2017 Pentasa was added. Unclear what happened subsequently but he has not followed up.   CTenterographyin April 2017 demonstrated long segment of terminal ileitis and a minimal inflammatory changes in the ascending colon.  He states that for over 4 years he has been having rectal bleeding diarrhea bloating abdominal distention and discomfort. . No family history of Crohn's disease or ulcerative colitis. He has been a tobacco smoker but recently changed to [place]. Did not notice any change in his GI symptoms since then. Denies any weight loss. Denies any use of NSAIDs. 02/10/2019: C. difficile toxin negative: GI PCR negative:  Fecal calprotectin elevated at 231: 02/09/2019: Hepatitis A antibody negative,T PMT normal enzyme activity. TB QuantiFERON Negative. Hepatitis B surface antibody negative nonreactive. HBsAg, hepatitis B E antigen, hepatitis B E antibody negative, CRP 4. 12/21/2018 HIV negative  02/24/2019: MRenterography: Active inflammation in the terminal 25 cm ileum extending to the IC valve with wall thickening, mucosal enhancement. Skip lesion seen in the ileum. Air-fluid level in the rectum. 4 mm gallbladder polyp or stone. 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  It was probably 8 mm in diameter.  I could not see through the lumen.  Biopsies were taken.Biopsies of the terminal ileum showed mild chronic active enteritis  Interval history10/14/2020-04/13/2019  Seen by Dr. Nadeen Landau at [facility] to evaluate whether he required surgery to treat the stricture of the small bowel.  He felt that since the patient was not overtly symptomatic, could try a trial of biologic agent to see if he gets any improvement.  This would hopefully postpone any surgery if not eventually prevent.    Current Outpatient Medications  Medication Sig Dispense Refill  . amLODipine (NORVASC) 5 MG tablet Take by mouth.    Marland Kitchen amLODipine (NORVASC) 5 MG tablet TAKE 1 TABLET BY MOUTH EVERY DAY    . clomiPHENE (CLOMID) 50 MG tablet Take 0.5 tablets (25 mg total) by mouth daily. (Patient not taking: Reported on 02/09/2019) 30 tablet 0  . losartan (COZAAR) 100 MG tablet Take by mouth.    . losartan (COZAAR) 50  MG tablet TAKE 2 TABLETS BY MOUTH EVERY DAY (100 MG ON BACKORDER)    . omeprazole (PRILOSEC) 40 MG capsule Take by mouth.    . PENTASA 500 MG CR capsule TAKE 1 CAPSULE BY MOUTH FOUR TIMES A DAY 120 capsule 1  . venlafaxine XR (EFFEXOR-XR) 75 MG 24 hr capsule TAKE 3 CAPSULES BY MOUTH EVERY DAY 270 capsule  0   No current facility-administered medications for this visit.     Allergies as of 04/13/2019  . (No Known Allergies)    Review of Systems:    All systems reviewed and negative except where noted in HPI.  General Appearance:    Alert, cooperative, no distress, appears stated age  Head:    Normocephalic, without obvious abnormality, atraumatic  Eyes:    PERRL, conjunctiva/corneas clear,  Ears:    Grossly normal hearing    Neurologic:  Grossly normal    Observations/Objective:  Labs: CMP     Component Value Date/Time   CREATININE 0.90 02/24/2019 0954   Lab Results  Component Value Date   WBC 10.6 (A) 11/13/2014   HGB 14.8 11/13/2014   HCT 44.1 11/13/2014   MCV 86.6 11/13/2014    Imaging Studies: No results found.  Assessment and Plan:   Kinston Magnan is a 33 y.o. y/o male  here to follow-up for Crohn's disease of the small bowel diagnosed in 2017 and has only been on sulfasalazine and Entocort since then.MR enterography demonstrates features in the ileum suggestive of small bowel Crohn's.  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  I did manage to take some biopsies which shows features of chronic active enteritis.  Fecal calprotectin is 231 suggesting ongoing mucosal inflammation of the GI tract.  Evaluated by Dr. Annye English at [facility] to decide whether he needs any surgery and felt based on his symptoms at this point no urgent surgery was required and to give him the option of a trial of a biologic to see if we could get some improvement in his inflammation and obstruction.   1. Since he is biologic naive and a young male : with the aim to avoid thiopurine+ anti TNF therapy due to the risks associated with malignancy: I would recommend monotherapy with Ustekinumab  Have discussed risks of ustekinumab to include but not limited to infections, tuberculosis, increased risk of malignancy, hypersensitivity  reactions, reversible posterior leukoencephalopathy syndrome, pneumonia, nasopharyngitis, injection site erythema, bronchitis, pleuritis, urinary tract infection, sinusitis.  I would suggest that in 4 to 5 months we should probably perform an MR angiogram to check for improvement in the inflammation. 2.  No NSAID use.    3.  Stop vaping as tobacco use can increase the severity of Crohn's disease.  4.  Please complete  hepatitis a and B vaccination series   I discussed the assessment and treatment plan with the patient. The patient was provided an opportunity to ask questions and all were answered. The patient agreed with the plan and demonstrated an understanding of the instructions.   The patient was advised to call back or seek an in-person evaluation if the symptoms worsen or if the condition fails to improve as anticipated.    Dr James Bellows MD,MRCP [facility] Gastroenterology/Hepatology Pager: [phone]   Speech recognition software was used to dictate this note.

## 2019-04-13 NOTE — Telephone Encounter (Signed)
Pt  Has a few more questions regarding Televisit from today he would like to know if he needs to continue the Pills he was prescript that start with a P? And  For the injections if he needs apts for those?

## 2019-04-13 NOTE — Telephone Encounter (Signed)
Called pt regarding his questions about Dr. Georgeann Oppenheim plan of care.  Unable to contact, LVM to return call

## 2019-04-17 NOTE — Telephone Encounter (Signed)
Spoke with pt and informed him that we have sent the prescription for the Stelara to a Specialty pharmacy so that pt will be able to receive the medication at an affordable price. I explained that the pharmacy will contact him to request additional information if needed. I also explained that the process for getting this medication to him could take a week or so depending on his insurance. I explained that he should continue the Pentasa as prescribed until he receives the Stelara. Pt understands and agrees.

## 2019-05-09 ENCOUNTER — Ambulatory Visit: Payer: 59 | Admitting: Gastroenterology

## 2019-05-11 ENCOUNTER — Telehealth: Payer: Self-pay

## 2019-05-11 NOTE — Telephone Encounter (Signed)
Called pt to enquire if he has been contacted by the [facility] regarding shipment of the Stelara and setting up the infusion for the induction dose. Pt states he has not heard from them. I then contacted [facility] and was informed that four automated calls to pt were attempted with no success. I was told to have pt contact them as the medication is ready to be shipped. I informed pt of this information and gave him their contact information, pt agreed to call.   Pt called back after confirming shipment details with [facility] and informed me that he's not comfortable with the home infusion offered by the pharmacy, pt states he'd rather receive the infusion at the cancer center in [place]. I explained that I will have to contact cancer center to try and set up the infusion. Pt understands and agrees.

## 2019-05-16 NOTE — Telephone Encounter (Signed)
Called pt to inform him that the cancer center is no longer accepting new non-oncology infusions and there are no local infusion centers that will perform the infusion unless pt uses their specialty pharmacy. [facility] has an infusion center and are willing to perform but pt would need to be referred and seen by one of their rheumatologist before receiving the infusion which would be more costly for the pt.  Unable to contact, LVM to return call

## 2019-05-17 NOTE — Telephone Encounter (Signed)
Spoke with pt and informed him of the information recorded in previous message. Pt now agrees that the home infusion option offered by the specialty pharmacy would be best. Pt plans to contact the pharmacy again to set up the home infusion. I have provided the pharmacy's contact information.

## 2019-05-21 ENCOUNTER — Telehealth: Payer: 59 | Admitting: Physician Assistant

## 2019-05-21 DIAGNOSIS — J069 Acute upper respiratory infection, unspecified: Secondary | ICD-10-CM

## 2019-05-21 MED ORDER — BENZONATATE 100 MG PO CAPS: 100.0000 mg | ORAL_CAPSULE | Freq: Three times a day (TID) | ORAL | 0 refills | Status: DC | PRN

## 2019-05-21 NOTE — Progress Notes (Signed)
We are sorry you are not feeling well.  Here is how we plan to help!  Based on what you have shared with me, it looks like you may have a viral upper respiratory infection.  Upper respiratory infections are caused by a large number of viruses; however, rhinovirus is the most common cause. Giving current COVID pandemic, if symptoms worsen or you have known exposure to COVID you need to be tested.   Symptoms vary from person to person, with common symptoms including sore throat, cough, fatigue or lack of energy and feeling of general discomfort.  A low-grade fever of up to 100.4 may present, but is often uncommon.  Symptoms vary however, and are closely related to a person's age or underlying illnesses.  The most common symptoms associated with an upper respiratory infection are nasal discharge or congestion, cough, sneezing, headache and pressure in the ears and face.  These symptoms usually persist for about 3 to 10 days, but can last up to 2 weeks.  It is important to know that upper respiratory infections do not cause serious illness or complications in most cases.    Upper respiratory infections can be transmitted from person to person, with the most common method of transmission being a person's hands.  The virus is able to live on the skin and can infect other persons for up to 2 hours after direct contact.  Also, these can be transmitted when someone coughs or sneezes; thus, it is important to cover the mouth to reduce this risk.  To keep the spread of the illness at [place], good hand hygiene is very important.  This is an infection that is most likely caused by a virus. There are no specific treatments other than to help you with the symptoms until the infection runs its course.  We are sorry you are not feeling well.  Here is how we plan to help!   For nasal congestion, you may use an oral decongestants such as Mucinex D or if you have glaucoma or high blood pressure use plain Mucinex.  Saline nasal  spray or nasal drops can help and can safely be used as often as needed for congestion.   If you do not have a history of heart disease, hypertension, diabetes or thyroid disease, prostate/bladder issues or glaucoma, you may also use Sudafed to treat nasal congestion.  It is highly recommended that you consult with a pharmacist or your primary care physician to ensure this medication is safe for you to take.     If you have a cough, you may use cough suppressants such as Delsym and Robitussin.  If you have glaucoma or high blood pressure, you can also use Coricidin HBP.   For cough I have prescribed for you A prescription cough medication called Tessalon Perles 100 mg. You may take 1-2 capsules every 8 hours as needed for cough  If you have a sore or scratchy throat, use a saltwater gargle-  to  teaspoon of salt dissolved in a 4-ounce to 8-ounce glass of warm water.  Gargle the solution for approximately 15-30 seconds and then spit.  It is important not to swallow the solution.  You can also use throat lozenges/cough drops and Chloraseptic spray to help with throat pain or discomfort.  Warm or cold liquids can also be helpful in relieving throat pain.  For headache, pain or general discomfort, you can use Ibuprofen or Tylenol as directed.   Some authorities believe that zinc sprays or the  use of Echinacea may shorten the course of your symptoms.   HOME CARE . Only take medications as instructed by your medical team. . Be sure to drink plenty of fluids. Water is fine as well as fruit juices, sodas and electrolyte beverages. You may want to stay away from caffeine or alcohol. If you are nauseated, try taking small sips of liquids. How do you know if you are getting enough fluid? Your urine should be a pale yellow or almost colorless. . Get rest. . Taking a steamy shower or using a humidifier may help nasal congestion and ease sore throat pain. You can place a towel over your head and breathe in the  steam from hot water coming from a faucet. . Using a saline nasal spray works much the same way. . Cough drops, hard candies and sore throat lozenges may ease your cough. . Avoid close contacts especially the very young and the elderly . Cover your mouth if you cough or sneeze . Always remember to wash your hands.   GET HELP RIGHT AWAY IF: . You develop worsening fever. . If your symptoms do not improve within 10 days . You develop yellow or green discharge from your nose over 3 days. . You have coughing fits . You develop a severe head ache or visual changes. . You develop shortness of breath, difficulty breathing or start having chest pain . Your symptoms persist after you have completed your treatment plan  MAKE SURE YOU   Understand these instructions.  Will watch your condition.  Will get help right away if you are not doing well or get worse.  Your e-visit answers were reviewed by a board certified advanced clinical practitioner to complete your personal care plan. Depending upon the condition, your plan could have included both over the counter or prescription medications. Please review your pharmacy choice. If there is a problem, you may call our nursing hot line at and have the prescription routed to another pharmacy. Your safety is important to [place]. If you have drug allergies check your prescription carefully.   You can use MyChart to ask questions about today's visit, request a non-urgent call back, or ask for a work or school excuse for 24 hours related to this e-Visit. If it has been greater than 24 hours you will need to follow up with your provider, or enter a new e-Visit to address those concerns. You will get an e-mail in the next two days asking about your experience.  I hope that your e-visit has been valuable and will speed your recovery. Thank you for using e-visits.

## 2019-05-21 NOTE — Progress Notes (Signed)
I have spent 5 minutes in review of e-visit questionnaire, review and updating patient chart, medical decision making and response to patient.   Miah Boye Cody Estoria Geary, PA-C    

## 2019-05-25 NOTE — Telephone Encounter (Signed)
Spoke with pt yesterday and informed him that the induction infusion would need to be completed at an infusion center. I explained that the specialty pharmacy was able to find a specialty clinic that offers infusions, however pt would need a consultation appointment with one of their GI specialist before the infusion can be scheduled. Pt understands and agrees. Pt is aware that the consult and infusion will take place at [facility]. Pt is scheduled for the consult visit today.

## 2019-06-14 ENCOUNTER — Ambulatory Visit: Payer: 59 | Admitting: Gastroenterology

## 2019-09-18 ENCOUNTER — Other Ambulatory Visit: Payer: Self-pay

## 2019-09-18 ENCOUNTER — Emergency Department: Payer: 59

## 2019-09-18 ENCOUNTER — Encounter: Payer: Self-pay | Admitting: Emergency Medicine

## 2019-09-18 ENCOUNTER — Telehealth: Payer: Self-pay

## 2019-09-18 ENCOUNTER — Emergency Department
Admission: EM | Admit: 2019-09-18 | Discharge: 2019-09-18 | Disposition: A | Discharge status: Home / Self Care | Payer: 59 | Attending: Emergency Medicine | Admitting: Emergency Medicine

## 2019-09-18 DIAGNOSIS — I10 Essential (primary) hypertension: Secondary | ICD-10-CM | POA: Insufficient documentation

## 2019-09-18 DIAGNOSIS — Z79899 Other long term (current) drug therapy: Secondary | ICD-10-CM | POA: Insufficient documentation

## 2019-09-18 DIAGNOSIS — K5000 Crohn's disease of small intestine without complications: Secondary | ICD-10-CM | POA: Diagnosis not present

## 2019-09-18 DIAGNOSIS — R110 Nausea: Secondary | ICD-10-CM | POA: Insufficient documentation

## 2019-09-18 DIAGNOSIS — R1033 Periumbilical pain: Secondary | ICD-10-CM | POA: Diagnosis present

## 2019-09-18 LAB — LIPASE, BLOOD: Lipase: 23 U/L (ref 11–51)

## 2019-09-18 LAB — URINALYSIS, COMPLETE (UACMP) WITH MICROSCOPIC
Bilirubin Urine: NEGATIVE
Glucose, UA: NEGATIVE mg/dL
Hgb urine dipstick: NEGATIVE
Ketones, ur: NEGATIVE mg/dL
Leukocytes,Ua: NEGATIVE
Nitrite: NEGATIVE
Protein, ur: NEGATIVE mg/dL
Specific Gravity, Urine: 1.006 (ref 1.005–1.030)
Squamous Epithelial / HPF: NONE SEEN (ref 0–5)
pH: 6 (ref 5.0–8.0)

## 2019-09-18 LAB — COMPREHENSIVE METABOLIC PANEL
ALT: 40 U/L (ref 0–44)
AST: 20 U/L (ref 15–41)
Albumin: 4.1 g/dL (ref 3.5–5.0)
Alkaline Phosphatase: 86 U/L (ref 38–126)
Anion gap: 9 (ref 5–15)
BUN: 9 mg/dL (ref 6–20)
CO2: 24 mmol/L (ref 22–32)
Calcium: 9 mg/dL (ref 8.9–10.3)
Chloride: 105 mmol/L (ref 98–111)
Creatinine, Ser: 0.94 mg/dL (ref 0.61–1.24)
GFR calc Af Amer: >60 mL/min (ref >60)
GFR calc non Af Amer: >60 mL/min (ref >60)
Glucose, Bld: 97 mg/dL (ref 70–99)
Potassium: 3.5 mmol/L (ref 3.5–5.1)
Sodium: 138 mmol/L (ref 135–145)
Total Bilirubin: 1.3 mg/dL — ABNORMAL HIGH (ref 0.3–1.2)
Total Protein: 6.9 g/dL (ref 6.5–8.1)

## 2019-09-18 LAB — CBC
HCT: 46.2 % (ref 39.0–52.0)
Hemoglobin: 16.1 g/dL (ref 13.0–17.0)
MCH: 30.4 pg (ref 26.0–34.0)
MCHC: 34.8 g/dL (ref 30.0–36.0)
MCV: 87.2 fL (ref 80.0–100.0)
Platelets: 461 10*3/uL — ABNORMAL HIGH (ref 150–400)
RBC: 5.3 MIL/uL (ref 4.22–5.81)
RDW: 11.9 % (ref 11.5–15.5)
WBC: 11.5 10*3/uL — ABNORMAL HIGH (ref 4.0–10.5)
nRBC: 0 % (ref 0.0–0.2)

## 2019-09-18 MED ORDER — METHYLPREDNISOLONE SODIUM SUCC 125 MG IJ SOLR
125.0000 mg | Freq: Once | INTRAMUSCULAR | Status: AC
  Administered 2019-09-18: 125 mg via INTRAVENOUS
  Filled 2019-09-18: qty 2

## 2019-09-18 MED ORDER — IOHEXOL 300 MG/ML  SOLN
125.0000 mL | Freq: Once | INTRAMUSCULAR | Status: AC | PRN
  Administered 2019-09-18: 125 mL via INTRAVENOUS
  Filled 2019-09-18: qty 125

## 2019-09-18 MED ORDER — MORPHINE SULFATE (PF) 4 MG/ML IV SOLN
4.0000 mg | Freq: Once | INTRAVENOUS | Status: AC
  Administered 2019-09-18: 4 mg via INTRAVENOUS
  Filled 2019-09-18: qty 1

## 2019-09-18 MED ORDER — ONDANSETRON HCL 4 MG/2ML IJ SOLN
4.0000 mg | Freq: Once | INTRAMUSCULAR | Status: AC
  Administered 2019-09-18: 4 mg via INTRAVENOUS
  Filled 2019-09-18: qty 2

## 2019-09-18 MED ORDER — PREDNISONE 10 MG PO TABS: ORAL_TABLET | ORAL | 0 refills | Status: DC

## 2019-09-18 NOTE — ED Triage Notes (Signed)
Pt reports abd pain all over that is sharp and stabbing since yesterday. Pt reports some nausea, denies vomiting. Pt reports SOB when the pain hits.

## 2019-09-18 NOTE — Telephone Encounter (Signed)
Haven't eaten since Saturday, loss appetite due to pain. Would like to be seen as soon as possible.

## 2019-09-18 NOTE — ED Provider Notes (Signed)
[facility] Emergency Department Provider Note   ____________________________________________    I have reviewed the triage vital signs and the nursing notes.   HISTORY  Chief Complaint Abdominal Pain     HPI James Haynes is a 34 y.o. male who presents with complaints of abdominal pain.  Patient reports a history of Crohn's disease, describes pain that started yesterday at 7 AM, decreased p.o. intake, pain is primarily in the lower abdomen, periumbilically.  No history of abdominal surgery.  No fevers or chills.  Mild nausea no vomiting.  No radiation of pain.  Is not take anything for this.  Past Medical History:  Diagnosis Date  . Anxiety   . Depression   . HTN (hypertension)   . Low testosterone     Patient Active Problem List   Diagnosis Date Noted  . Essential hypertension 02/19/2016  . Bipolar 1 disorder, mixed, moderate ([place]) 08/03/2012  . Bipolar disorder, current episode mixed, moderate ([place]) 08/03/2012    Past Surgical History:  Procedure Laterality Date  . COLONOSCOPY WITH PROPOFOL N/A 03/17/2019   Procedure: COLONOSCOPY WITH PROPOFOL;  Surgeon: Jonathon Bellows, MD;  Location[facility]COPY;  Service: Gastroenterology;  Laterality: N/A;  . FOOT SURGERY  2012    Prior to Admission medications   Medication Sig Start Date End Date Taking? Authorizing Provider  amLODipine (NORVASC) 5 MG tablet Take by mouth. 11/26/17 11/26/18  [provider]  amLODipine (NORVASC) 5 MG tablet TAKE 1 TABLET BY MOUTH EVERY DAY 08/10/18   [provider]  benzonatate (TESSALON) 100 MG capsule Take 1 capsule (100 mg total) by mouth 3 (three) times daily as needed for cough. 05/21/19   Brunetta Jeans, PA-C  clomiPHENE (CLOMID) 50 MG tablet Take 0.5 tablets (25 mg total) by mouth daily. Patient not taking: Reported on 02/09/2019 07/12/18   Abbie Sons, MD  losartan (COZAAR) 100 MG tablet Take by mouth. 12/30/17   [provider]    losartan (COZAAR) 50 MG tablet TAKE 2 TABLETS BY MOUTH EVERY DAY (100 MG ON BACKORDER) 01/31/19   [provider]  omeprazole (PRILOSEC) 40 MG capsule Take by mouth. 03/14/18 09/10/18  [provider]  PENTASA 500 MG CR capsule TAKE 1 CAPSULE BY MOUTH FOUR TIMES A DAY 04/11/19   Jonathon Bellows, MD  predniSONE (DELTASONE) 10 MG tablet Take 4 tablets (40 mg total) by mouth daily for 4 days, THEN 3 tablets (30 mg total) daily for 4 days, THEN 2 tablets (20 mg total) daily for 4 days, THEN 1 tablet (10 mg total) daily for 4 days. 09/18/19 10/04/19  Lavonia Drafts, MD  venlafaxine XR (EFFEXOR-XR) 75 MG 24 hr capsule TAKE 3 CAPSULES BY MOUTH EVERY DAY 12/07/15   Ivar Drape D, PA     Allergies Patient has no known allergies.  Family History  Problem Relation Age of Onset  . Prostate cancer Paternal Grandfather   . Kidney cancer Neg Hx   . Bladder Cancer Neg Hx     Social History Social History   Tobacco Use  . Smoking status: Former Smoker    Types: Cigarettes  . Smokeless tobacco: Former Systems developer    Types: Chew  Substance Use Topics  . Alcohol use: Yes    Alcohol/week: 2.0 standard drinks    Types: 2 Cans of beer per week  . Drug use: No    Review of Systems  Constitutional: No fever/chills Eyes: No visual changes.  ENT: No sore throat. Cardiovascular:  Denies chest pain. Respiratory: Denies shortness of breath. Gastrointestinal: As above Genitourinary: No hematuria Musculoskeletal: Negative for back pain. Skin: Negative for rash. Neurological: Negative for headaches    ____________________________________________   PHYSICAL EXAM:  VITAL SIGNS: ED Triage Vitals [09/18/19 1448]  Enc Vitals Group     BP (!) 146/118     Pulse Rate 100     Resp 20     Temp 98.8 F (37.1 C)     Temp Source Oral     SpO2 100 %     Weight 117.9 kg (260 lb)     Height 1.829 m (6')     Head Circumference      Peak Flow      Pain Score 9     Pain Loc      Pain Edu?       Excl. in [place]?     Constitutional: Alert and oriented.   Nose: No congestion/rhinnorhea. Mouth/Throat: Mucous membranes are moist.   Neck:  Painless ROM Cardiovascular: Normal rate, regular rhythm. Grossly normal heart sounds.  Good peripheral circulation. Respiratory: Normal respiratory effort.  No retractions. Lungs CTAB. Gastrointestinal: Abdomen is soft, tenderness palpation of the right lower quadrant, no distention, no CVA tenderness  Musculoskeletal: No lower extremity tenderness nor edema.  Warm and well perfused Neurologic:  Normal speech and language. No gross focal neurologic deficits are appreciated.  Skin:  Skin is warm, dry and intact. No rash noted. Psychiatric: Mood and affect are normal. Speech and behavior are normal.  ____________________________________________   LABS (all labs ordered are listed, but only abnormal results are displayed)  Labs Reviewed  COMPREHENSIVE METABOLIC PANEL - Abnormal; Notable for the following components:      Result Value   Total Bilirubin 1.3 (*)    All other components within normal limits  CBC - Abnormal; Notable for the following components:   WBC 11.5 (*)    Platelets 461 (*)    All other components within normal limits  URINALYSIS, COMPLETE (UACMP) WITH MICROSCOPIC - Abnormal; Notable for the following components:   Color, Urine YELLOW (*)    APPearance CLEAR (*)    Bacteria, UA RARE (*)    All other components within normal limits  LIPASE, BLOOD   ____________________________________________  EKG  ED ECG REPORT I, Lavonia Drafts, the attending physician, personally viewed and interpreted this ECG.  Date: 09/18/2019  Rhythm: normal sinus rhythm QRS Axis: normal Intervals: normal ST/T Wave abnormalities: normal Narrative Interpretation: no evidence of acute ischemia  ____________________________________________  RADIOLOGY  CT abdomen pelvis most consistent with Crohn's  flare ____________________________________________   PROCEDURES  Procedure(s) performed: No  Procedures   Critical Care performed: No ____________________________________________   INITIAL IMPRESSION / ASSESSMENT AND PLAN / ED COURSE  Pertinent labs & imaging results that were available during my care of the patient were reviewed by me and considered in my medical decision making (see chart for details).  Patient with a history of Crohn's disease presents with abdominal pain, reports this does not feel consistent with Crohn's flare, reports he is consistent with his Crohn's treatment, receives injection every 8 weeks.  Patient with significant tenderness right lower quadrant raises the possibility of appendicitis.  Differential includes Crohn's flare, appendicitis, colitis, ureterolithiasis.  Will give IV morphine, IV Zofran, obtain CT abdomen pelvis and reevaluate.  CT scan demonstrates inflammation of the terminal ileum consistent with Crohn's flare.  Discussed this with patient and offered admission however he would prefer to try home oral  steroids initially and notes that he can return if symptoms worsen.  We will give him initial dose of IV Solu-Medrol here.    ____________________________________________   FINAL CLINICAL IMPRESSION(S) / ED DIAGNOSES  Final diagnoses:  Crohn's disease involving terminal ileum ([place])        Note:  This document was prepared using Dragon voice recognition software and may include unintentional dictation errors.   Lavonia Drafts, MD 09/18/19 [phone]

## 2019-09-25 ENCOUNTER — Other Ambulatory Visit: Payer: Self-pay

## 2019-09-25 ENCOUNTER — Ambulatory Visit: Payer: 59 | Admitting: Gastroenterology

## 2019-09-25 VITALS — BP 167/122 | HR 72 | Temp 98.3°F | Ht 72.0 in | Wt 257.0 lb

## 2019-09-25 DIAGNOSIS — K50018 Crohn's disease of small intestine with other complication: Secondary | ICD-10-CM | POA: Diagnosis not present

## 2019-09-25 MED ORDER — PREDNISONE 10 MG PO TABS: ORAL_TABLET | ORAL | 0 refills | Status: DC

## 2019-09-25 NOTE — Progress Notes (Signed)
Jonathon Bellows MD, MRCP(U.K) [address]  [place]  [place], [place] [address]  Main: [phone]  Fax: [phone]   Primary Care Physician: Marinda Elk, MD  Primary Gastroenterologist:  Dr. Jonathon Bellows    ED visit follow up   HPI: James Haynes is a 34 y.o. male    Summary of history :  He was initially referred and seen in 02/26/2019 for chronic diarrhea and transferred his care from [place]  [facility] to myself. He was previously been seen by Dr. 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 Subsequently in June 2017 Pentasa was added. Unclear what happened subsequently but he has not followed up.   For over 4 years he had been having rectal bleeding diarrhea bloating abdominal distention and discomfort. . No family history of Crohn's disease or ulcerative colitis. He has been a tobacco smoker but recently changed to [place]. Did not notice any change in his GI symptoms since then. Denies any weight loss. Denied any use of NSAIDs. 02/10/2019: C. difficile toxin negative: GI PCR negative: Fecal calprotectin elevated at 231: 02/09/2019: Hepatitis A antibody negative,T PMT normal enzyme activity. TB QuantiFERON Negative. Hepatitis B surface antibody negative nonreactive. HBsAg, hepatitis B E antigen, hepatitis B E antibody negative, CRP 4. 12/21/2018 HIV negative  02/24/2019: MRenterography: Active inflammation in the terminal 25 cm ileum extending to the IC valve with wall thickening, mucosal enhancement. Skip lesion seen in the ileum. Air-fluid level in the rectum. 4 mm gallbladder polyp or stone. 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 It was probably 8 mm in  diameter. I could not see through the lumen. 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 Nadeen Landau at [facility] to evaluate whether he required surgery to treat the stricture of the small bowel.  He felt that since the patient was not overtly symptomatic, could try a trial of biologic agent to see if he gets any improvement.  This would hopefully postpone any surgery if not eventually prevent.     Interval history11/10/2018-09/25/2019  He presented to the emergency room on 09/18/2019 with abdominal pain.  He has had abdominal bloating gas and distention.  No lower GI symptoms.  Commenced on a steroid tapering dose over a few days.  Presently feels better.  Denies any NSAID use.  Has been taking Stelara as directed.  On and off vaping.   09/18/2019 CT scan of the abdomen and pelvis with contrast : extensive inflammatory wall thickening and mucosal hyperenhancement of the terminal ileum affecting approximately 40 cm of the most distal small bowel.  Inflammatory findings are increased in comparison to prior examinations and there is again seen chronic appearing fibrofatty mural stratification of the affected segments.  Findings are consistent with Crohn's disease without evidence of acute complication.  There are focally narrowed appearing segments of bowel without overt evidence of stricture or obstruction.  Small volume reactive ascites throughout the abdomen.  09/18/2019: Hemoglobin 16.1 g, platelet count 461, white cell count 11.5, CMP normal, lipase normal, UA negative.  Current Outpatient Medications  Medication Sig Dispense Refill  . amLODipine (NORVASC) 5 MG tablet Take by mouth.    [place] Kitchen amLODipine (NORVASC)  5 MG tablet TAKE 1 TABLET BY MOUTH EVERY DAY    . benzonatate (TESSALON) 100 MG capsule Take 1 capsule (100 mg total) by mouth 3 (three) times daily as needed for cough. 30 capsule 0  . clomiPHENE (CLOMID) 50 MG tablet Take  0.5 tablets (25 mg total) by mouth daily. (Patient not taking: Reported on 02/09/2019) 30 tablet 0  . losartan (COZAAR) 100 MG tablet Take by mouth.    . losartan (COZAAR) 50 MG tablet TAKE 2 TABLETS BY MOUTH EVERY DAY (100 MG ON BACKORDER)    . omeprazole (PRILOSEC) 40 MG capsule Take by mouth.    . PENTASA 500 MG CR capsule TAKE 1 CAPSULE BY MOUTH FOUR TIMES A DAY 120 capsule 1  . predniSONE (DELTASONE) 10 MG tablet Take 4 tablets (40 mg total) by mouth daily for 4 days, THEN 3 tablets (30 mg total) daily for 4 days, THEN 2 tablets (20 mg total) daily for 4 days, THEN 1 tablet (10 mg total) daily for 4 days. 40 tablet 0  . venlafaxine XR (EFFEXOR-XR) 75 MG 24 hr capsule TAKE 3 CAPSULES BY MOUTH EVERY DAY 270 capsule 0   No current facility-administered medications for this visit.    Allergies as of 09/25/2019  . (No Known Allergies)    ROS:  General: Negative for anorexia, weight loss, fever, chills, fatigue, weakness. ENT: Negative for hoarseness, difficulty swallowing , nasal congestion. CV: Negative for chest pain, angina, palpitations, dyspnea on exertion, peripheral edema.  Respiratory: Negative for dyspnea at rest, dyspnea on exertion, cough, sputum, wheezing.  GI: See history of present illness. GU:  Negative for dysuria, hematuria, urinary incontinence, urinary frequency, nocturnal urination.  Endo: Negative for unusual weight change.    Physical Examination:   There were no vitals taken for this visit.  General: Well-nourished, well-developed in no acute distress.  Eyes: No icterus. Conjunctivae pink. Mouth: Oropharyngeal mucosa moist and pink , no lesions erythema or exudate. Abdomen: Bowel sounds are normal, mild right lower quadrant nondistended, no hepatosplenomegaly or masses, no abdominal bruits or hernia , no rebound or guarding.   Extremities: No lower extremity edema. No clubbing or deformities. Neuro: Alert and oriented x 3.  Grossly intact. Skin: Warm and dry,  no jaundice.   Psych: Alert and cooperative, normal mood and affect.   Imaging Studies: CT ABDOMEN PELVIS W CONTRAST  Result Date: 09/18/2019 CLINICAL DATA:  Right lower quadrant pain since yesterday, appendicitis suspected EXAM: CT ABDOMEN AND PELVIS WITH CONTRAST TECHNIQUE: Multidetector CT imaging of the abdomen and pelvis was performed using the standard protocol following bolus administration of intravenous contrast. CONTRAST:  171m OMNIPAQUE IOHEXOL 300 MG/ML  SOLN COMPARISON:  CT abdomen pelvis, 04/07/2017, MR angiogram, 02/24/2019 FINDINGS: Lower chest: No acute abnormality. Hepatobiliary: No solid liver abnormality is seen. Hepatic steatosis. No gallstones, gallbladder wall thickening, or biliary dilatation. Pancreas: Unremarkable. No pancreatic ductal dilatation or surrounding inflammatory changes. Spleen: Normal in size without significant abnormality. Adrenals/Urinary Tract: Adrenal glands are unremarkable. Kidneys are normal, without renal calculi, solid lesion, or hydronephrosis. Bladder is unremarkable. Stomach/Bowel: Stomach is within normal limits. Appendix appears normal. There is extensive inflammatory wall thickening and mucosal hyperenhancement of the terminal ileum, affecting approximately 40 cm of the most distal small bowel. Inflammatory findings are increased in comparison to prior examinations, and there is again seen chronic appearing fibrofatty mural stratification of the affected segments. There are focally narrowed appearing segments of bowel (e.g. series 2, image 53, 66) without overt evidence of stricture  or bowel obstruction. Vascular/Lymphatic: No significant vascular findings are present. No enlarged abdominal or pelvic lymph nodes. Reproductive: No mass or other significant abnormality. Other: No abdominal wall hernia or abnormality. Small volume ascites throughout the abdomen. Musculoskeletal: No acute or significant osseous findings. IMPRESSION: 1. There is extensive  inflammatory wall thickening and mucosal hyperenhancement of the terminal ileum, affecting approximately 40 cm of the most distal small bowel. Inflammatory findings are increased in comparison to prior examinations, and there is again seen chronic appearing fibrofatty mural stratification of the affected segments. Findings are consistent with Crohn's disease without evidence of acute complication. 2. There are focally narrowed appearing segments of bowel (e.g. series 2, image 53, 66) without overt evidence of stricture or obstruction. 3.  Small volume reactive ascites throughout the abdomen. 4.  Normal appendix. 5.  Hepatic steatosis. Electronically Signed   By: Eddie Candle M.D.   On: 09/18/2019 16:38    Assessment and Plan:   Jasman Murri is a 34 y.o. 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   Evaluated by Dr. Annye English at [facility] to decide whether he needs any surgery and felt based on his symptoms at this point no urgent surgery was required and to give him the option of a trial of a biologic to see if we could get some improvement in his inflammation and obstruction. Commenced on Stelara in 06/2019, was doing well and present to the emergency room with worsening of abdominal pain and showed CT shows worsening of his small bowel Crohn's disease.  I would plan to decrease the interval of his dosage from 90 mg every 12 weeks to every 8 weeks.  If that does not work we would need to change plans to probably [place].  He is also planning to see Dr. Dema Severin at [facility] as a follow-up.  1.   Decrease interval of Stelara to 90 mg every 8 weeks.  Continue steroids, will provide a prescription for 35 mg for 7 days and  decreasing by 5 mg every week and I will gradually taper it off depending on his response.  Check CBC, CRP today  2. No NSAID use.   3. Stop vaping as tobacco use can increase the severity of Crohn's disease.  4.   Telephone follow-up in 2 weeks with labs for CBC, CRP, CMP  Dr Jonathon Bellows  MD,MRCP [facility]) Follow up in 2 to 3 weeks telephone visit

## 2019-10-04 ENCOUNTER — Telehealth: Payer: Self-pay

## 2019-10-04 NOTE — Telephone Encounter (Signed)
Pt's Stelara PA is complete and has been approved.

## 2019-10-04 NOTE — Telephone Encounter (Signed)
[employer] left a voicemail stating that patient Stelara PA could not be sent through cover my meds. We would need to call [employer] [facility] directly to do the PA on medication. The phone number is [phone]

## 2019-10-12 ENCOUNTER — Ambulatory Visit: Payer: 59 | Admitting: Gastroenterology

## 2019-10-12 NOTE — Progress Notes (Deleted)
Jonathon Bellows MD, MRCP(U.K) [address]  [place]  [place], [place] [address]  Main: [phone]  Fax: [phone]   Primary Care Physician: Marinda Elk, MD  Primary Gastroenterologist:  Dr. Jonathon Bellows   No chief complaint on file.   HPI: James Haynes is a 34 y.o. male   Summary of history :  He was initially referred and seen in 02/26/2019 for chronic diarrhea and transferred his care from [facility] to myself. He was previously been seen by Dr. Vira Agar who has diagnosed him with inflammatory bowel disease: IBD-crohns disease of the small bowel in 2017 with ileitis and colitis of the ascending colon in 2017 .Commenced on Entocort in May 2017 for 2 months. Subsequently in June 2017 Pentasa was added. Unclear what happened subsequently but he has not followed up.   For over 4 years he had been having rectal bleeding diarrhea bloating abdominal distention and discomfort. . No family history of Crohn's disease or ulcerative colitis. He has been a tobacco smoker but recently changed to [place]. Did not notice any change in his GI symptoms since then. Denies any weight loss. Denied any use of NSAIDs. 02/10/2019: C. difficile toxin negative: GI PCR negative: Fecal calprotectin elevated at 231: 02/09/2019: Hepatitis A antibody negative,T PMT normal enzyme activity. TB QuantiFERON Negative. Hepatitis B surface antibody negative nonreactive. HBsAg, hepatitis B E antigen, hepatitis B E antibody negative, CRP 4. 12/21/2018 HIV negative  02/24/2019: MRenterography: Active inflammation in the terminal 25 cm ileum extending to the IC valve with wall thickening, mucosal enhancement. Skip lesion seen in the ileum. Air-fluid level in the rectum. 4 mm gallbladder polyp or stone. 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 It was probably 8 mm  in diameter. I could not see through the lumen. 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 Nadeen Landau at [facility] to evaluate whether he required surgery to treat the stricture of the small bowel. He felt that since the patient was not overtly symptomatic, could try a trial of biologic agent to see if he gets any improvement. This wouldhopefully postpone any surgery if not eventually prevent.   He presented to the emergency room on 09/18/2019 with abdominal pain.  He has had abdominal bloating gas and distention.  No lower GI symptoms.  Commenced on a steroid tapering dose over a few days. 09/18/2019 CT scan of the abdomen and pelvis with contrast : extensive inflammatory wall thickening and mucosal hyperenhancement of the terminal ileum affecting approximately 40 cm of the most distal small bowel.  Inflammatory findings are increased in comparison to prior examinations and there is again seen chronic appearing fibrofatty mural stratification of the affected segments.  Findings are consistent with Crohn's disease without evidence of acute complication.  There are focally narrowed appearing segments of bowel without overt evidence of stricture or obstruction.  Small volume reactive ascites throughout the abdomen.   Interval history4/19/2021-10/12/2019      09/18/2019: Hemoglobin 16.1 g, platelet count 461, white cell count 11.5, CMP normal, lipase normal, UA negative.     Current Outpatient Medications  Medication Sig Dispense Refill  . amLODipine (NORVASC) 5 MG tablet Take by mouth.    Mar[place] Kitchen amLODipine (NORVASC) 5 MG tablet TAKE 1 TABLET BY MOUTH EVERY DAY    . benzonatate (TESSALON) 100 MG capsule Take 1 capsule (100  mg total) by mouth 3 (three) times daily as needed for cough. (Patient not taking: Reported on 09/25/2019) 30 capsule 0  . clomiPHENE (CLOMID) 50 MG tablet Take 0.5 tablets (25 mg total) by mouth daily.  (Patient not taking: Reported on 02/09/2019) 30 tablet 0  . losartan (COZAAR) 100 MG tablet Take by mouth.    . losartan (COZAAR) 50 MG tablet TAKE 2 TABLETS BY MOUTH EVERY DAY (100 MG ON BACKORDER)    . omeprazole (PRILOSEC) 40 MG capsule Take by mouth.    . predniSONE (DELTASONE) 10 MG tablet Take 35 mg by mouth daily for 7 days, THEN decrease by 5 mg every 7 days 100 tablet 0  . STELARA 90 MG/ML SOSY injection     . venlafaxine XR (EFFEXOR-XR) 75 MG 24 hr capsule TAKE 3 CAPSULES BY MOUTH EVERY DAY 270 capsule 0   No current facility-administered medications for this visit.    Allergies as of 10/12/2019  . (No Known Allergies)    ROS:  General: Negative for anorexia, weight loss, fever, chills, fatigue, weakness. ENT: Negative for hoarseness, difficulty swallowing , nasal congestion. CV: Negative for chest pain, angina, palpitations, dyspnea on exertion, peripheral edema.  Respiratory: Negative for dyspnea at rest, dyspnea on exertion, cough, sputum, wheezing.  GI: See history of present illness. GU:  Negative for dysuria, hematuria, urinary incontinence, urinary frequency, nocturnal urination.  Endo: Negative for unusual weight change.    Physical Examination:   There were no vitals taken for this visit.  General: Well-nourished, well-developed in no acute distress.  Eyes: No icterus. Conjunctivae pink. Mouth: Oropharyngeal mucosa moist and pink , no lesions erythema or exudate. Lungs: Clear to auscultation bilaterally. Non-labored. Heart: Regular rate and rhythm, no murmurs rubs or gallops.  Abdomen: Bowel sounds are normal, nontender, nondistended, no hepatosplenomegaly or masses, no abdominal bruits or hernia , no rebound or guarding.   Extremities: No lower extremity edema. No clubbing or deformities. Neuro: Alert and oriented x 3.  Grossly intact. Skin: Warm and dry, no jaundice.   Psych: Alert and cooperative, normal mood and affect.   Imaging Studies: CT ABDOMEN  PELVIS W CONTRAST  Result Date: 09/18/2019 CLINICAL DATA:  Right lower quadrant pain since yesterday, appendicitis suspected EXAM: CT ABDOMEN AND PELVIS WITH CONTRAST TECHNIQUE: Multidetector CT imaging of the abdomen and pelvis was performed using the standard protocol following bolus administration of intravenous contrast. CONTRAST:  165m OMNIPAQUE IOHEXOL 300 MG/ML  SOLN COMPARISON:  CT abdomen pelvis, 04/07/2017, MR angiogram, 02/24/2019 FINDINGS: Lower chest: No acute abnormality. Hepatobiliary: No solid liver abnormality is seen. Hepatic steatosis. No gallstones, gallbladder wall thickening, or biliary dilatation. Pancreas: Unremarkable. No pancreatic ductal dilatation or surrounding inflammatory changes. Spleen: Normal in size without significant abnormality. Adrenals/Urinary Tract: Adrenal glands are unremarkable. Kidneys are normal, without renal calculi, solid lesion, or hydronephrosis. Bladder is unremarkable. Stomach/Bowel: Stomach is within normal limits. Appendix appears normal. There is extensive inflammatory wall thickening and mucosal hyperenhancement of the terminal ileum, affecting approximately 40 cm of the most distal small bowel. Inflammatory findings are increased in comparison to prior examinations, and there is again seen chronic appearing fibrofatty mural stratification of the affected segments. There are focally narrowed appearing segments of bowel (e.g. series 2, image 53, 66) without overt evidence of stricture or bowel obstruction. Vascular/Lymphatic: No significant vascular findings are present. No enlarged abdominal or pelvic lymph nodes. Reproductive: No mass or other significant abnormality. Other: No abdominal wall hernia or abnormality. Small volume ascites throughout the abdomen.  Musculoskeletal: No acute or significant osseous findings. IMPRESSION: 1. There is extensive inflammatory wall thickening and mucosal hyperenhancement of the terminal ileum, affecting approximately 40  cm of the most distal small bowel. Inflammatory findings are increased in comparison to prior examinations, and there is again seen chronic appearing fibrofatty mural stratification of the affected segments. Findings are consistent with Crohn's disease without evidence of acute complication. 2. There are focally narrowed appearing segments of bowel (e.g. series 2, image 53, 66) without overt evidence of stricture or obstruction. 3.  Small volume reactive ascites throughout the abdomen. 4.  Normal appendix. 5.  Hepatic steatosis. Electronically Signed   By: Eddie Candle M.D.   On: 09/18/2019 16:38    Assessment and Plan:   Tyre Beaver is a 34 y.o. 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  Evaluated by Dr. Annye English at [facility] to decide whether he needs any surgery and felt based on his symptoms at this point no urgent surgery was required and to give him the option of a trial of a biologic to see if we could get some improvement in his inflammation and obstruction. Commenced on Stelara in 06/2019, was doing well and present to the emergency room with worsening of abdominal pain and showed CT shows worsening of his small bowel Crohn's disease.  I would plan to decrease the interval of his dosage from 90 mg every 12 weeks to every 8 weeks.  If that does not work we would need to change plans to probably [place].  He is also planning to see Dr. Dema Severin at [facility] as a follow-up.  1.  Decrease interval of Stelara to 90 mg every 8 weeks.  Continue steroids, will provide a prescription for 35 mg for 7 days and decreasing by 5 mg every week and I will gradually taper it off depending on his response.  Check CBC, CRP  today  2. No NSAID use.   3. Stop vaping as tobacco use can increase the severity of Crohn's disease.  4.  Telephone follow-up in 2 weeks with labs for CBC, CRP, CMP  Dr Jonathon Bellows  MD,MRCP [facility]) Follow up in ***

## 2019-10-17 ENCOUNTER — Encounter: Payer: Self-pay | Admitting: Gastroenterology

## 2019-10-17 LAB — C-REACTIVE PROTEIN: CRP: 3 mg/L (ref 0–10)

## 2019-10-17 LAB — CBC WITH DIFFERENTIAL/PLATELET
Basophils Absolute: 0 10*3/uL (ref 0.0–0.2)
Basos: 0 %
EOS (ABSOLUTE): 0 10*3/uL (ref 0.0–0.4)
Eos: 0 %
Hematocrit: 44.9 % (ref 37.5–51.0)
Hemoglobin: 15.7 g/dL (ref 13.0–17.7)
Immature Grans (Abs): 0.1 10*3/uL (ref 0.0–0.1)
Immature Granulocytes: 1 %
Lymphocytes Absolute: 0.8 10*3/uL (ref 0.7–3.1)
Lymphs: 10 %
MCH: 30.4 pg (ref 26.6–33.0)
MCHC: 35 g/dL (ref 31.5–35.7)
MCV: 87 fL (ref 79–97)
Monocytes Absolute: 0.4 10*3/uL (ref 0.1–0.9)
Monocytes: 5 %
Neutrophils Absolute: 6.7 10*3/uL (ref 1.4–7.0)
Neutrophils: 84 %
Platelets: 409 10*3/uL (ref 150–450)
RBC: 5.16 x10E6/uL (ref 4.14–5.80)
RDW: 12.8 % (ref 11.6–15.4)
WBC: 8 10*3/uL (ref 3.4–10.8)

## 2019-10-17 LAB — BASIC METABOLIC PANEL
BUN/Creatinine Ratio: 11 (ref 9–20)
BUN: 9 mg/dL (ref 6–20)
CO2: 23 mmol/L (ref 20–29)
Calcium: 9.8 mg/dL (ref 8.7–10.2)
Chloride: 104 mmol/L (ref 96–106)
Creatinine, Ser: 0.85 mg/dL (ref 0.76–1.27)
GFR calc Af Amer: 131 mL/min/{1.73_m2} (ref >59)
GFR calc non Af Amer: 114 mL/min/{1.73_m2} (ref >59)
Glucose: 109 mg/dL — ABNORMAL HIGH (ref 65–99)
Potassium: 4.2 mmol/L (ref 3.5–5.2)
Sodium: 141 mmol/L (ref 134–144)

## 2019-10-18 ENCOUNTER — Other Ambulatory Visit: Payer: Self-pay | Admitting: Gastroenterology

## 2022-05-19 ENCOUNTER — Ambulatory Visit: Payer: Self-pay | Admitting: Gastroenterology

## 2022-05-19 ENCOUNTER — Other Ambulatory Visit: Payer: Self-pay

## 2022-05-19 NOTE — Progress Notes (Incomplete)
Jonathon Bellows MD, MRCP(U.K) [address]  [place], [place] [address]  Main: [phone]  Fax: [phone]   Primary Care Physician: Marinda Elk, MD  Primary Gastroenterologist:  Dr. Jonathon Bellows   No chief complaint on file.   HPI: James Haynes is a 36 y.o. male Summary of history :   He was initially referred and seen in 02/26/2019 for chronic diarrhea and transferred his care from [place]  [facility] to myself back in 2021 .  He was previously been seen by Dr. Vira Agar who has diagnosed him with inflammatory bowel disease: IBD-crohns disease of the small bowel in 2017 with ileitis and colitis of the ascending colon in 2017   . Commenced on Entocort in May 2017 for 2 months.  Subsequently in June 2017 Pentasa was added.  Unclear what happened subsequently but he did  not followed up.  After his last visit in 2021 he has not followed up either      No family history of Crohn's disease or ulcerative colitis.  He has been a tobacco smoker but recently changed to [place].  Did not notice any change in his GI symptoms since then.  Denies any weight loss.  Denied any use of NSAIDs.  02/10/2019: C. difficile toxin negative: GI PCR negative: Fecal calprotectin elevated at 231: 02/09/2019: Hepatitis A antibody negative, T PMT normal enzyme activity.  TB QuantiFERON Negative.  Hepatitis B surface antibody negative nonreactive.  HBsAg, hepatitis B E antigen, hepatitis B E antibody negative, CRP 4.  12/21/2018 HIV negative   02/24/2019: MR enterography: Active inflammation in the terminal 25 cm ileum extending to the IC valve with wall thickening, mucosal enhancement.  Skip lesion seen in the ileum.  Air-fluid level in the rectum.  4 mm gallbladder polyp or stone.   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  It was probably 8 mm in diameter.  I could not see  through the lumen.  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 Nadeen Landau at [facility] to evaluate whether he required surgery to treat the stricture of the small bowel.  He felt that since the patient was not overtly symptomatic, could try a trial of biologic agent to see if he gets any improvement.  This would hopefully postpone any surgery if not eventually prevent.   Had been taking Stelara as directed.  On and off vaping.  09/18/2019 CT scan of the abdomen and pelvis with contrast : extensive inflammatory wall thickening and mucosal hyperenhancement of the terminal ileum affecting approximately 40 cm of the most distal small bowel.  Inflammatory findings are increased in comparison to prior examinations and there is again seen chronic appearing fibrofatty mural stratification of the affected segments.  Findings are consistent with Crohn's disease without evidence of acute complication.  There are focally narrowed appearing segments of bowel without overt evidence of stricture or obstruction.  Small volume reactive ascites throughout the abdomen.    Interval history 09/25/2019--05/19/2022  Did not follow-up for over 2 years At his last visit over 2 years back was commenced on steroids with a taper with the plan to transition him to [facility] which was approved 9 days later he did not follow-up.        Current Outpatient Medications  Medication Sig Dispense Refill   amLODipine (NORVASC)  5 MG tablet Take by mouth.     amLODipine (NORVASC) 5 MG tablet TAKE 1 TABLET BY MOUTH EVERY DAY     benzonatate (TESSALON) 100 MG capsule Take 1 capsule (100 mg total) by mouth 3 (three) times daily as needed for cough. (Patient not taking: Reported on 09/25/2019) 30 capsule 0   clomiPHENE (CLOMID) 50 MG tablet Take 0.5 tablets (25 mg total) by mouth daily. (Patient not taking: Reported on 02/09/2019) 30 tablet 0   cyanocobalamin (VITAMIN B12) 1000  MCG tablet Take 1,000 mcg by mouth daily.     cyclobenzaprine (FLEXERIL) 5 MG tablet Take 5 mg by mouth 3 (three) times daily as needed.     Docosahexaenoic Acid 100 MG CAPS Take 1 capsule by mouth daily.     losartan (COZAAR) 100 MG tablet Take by mouth.     losartan (COZAAR) 50 MG tablet TAKE 2 TABLETS BY MOUTH EVERY DAY (100 MG ON BACKORDER)     omeprazole (PRILOSEC) 40 MG capsule Take by mouth.     predniSONE (DELTASONE) 10 MG tablet Take 35 mg by mouth daily for 7 days, THEN decrease by 5 mg every 7 days 100 tablet 0   STELARA 90 MG/ML SOSY injection      venlafaxine XR (EFFEXOR-XR) 75 MG 24 hr capsule TAKE 3 CAPSULES BY MOUTH EVERY DAY 270 capsule 0   No current facility-administered medications for this visit.    Allergies as of 05/19/2022   (No Known Allergies)    ROS:  General: Negative for anorexia, weight loss, fever, chills, fatigue, weakness. ENT: Negative for hoarseness, difficulty swallowing , nasal congestion. CV: Negative for chest pain, angina, palpitations, dyspnea on exertion, peripheral edema.  Respiratory: Negative for dyspnea at rest, dyspnea on exertion, cough, sputum, wheezing.  GI: See history of present illness. GU:  Negative for dysuria, hematuria, urinary incontinence, urinary frequency, nocturnal urination.  Endo: Negative for unusual weight change.    Physical Examination:   There were no vitals taken for this visit.  General: Well-nourished, well-developed in no acute distress.  Eyes: No icterus. Conjunctivae pink. Mouth: Oropharyngeal mucosa moist and pink , no lesions erythema or exudate. Lungs: Clear to auscultation bilaterally. Non-labored. Heart: Regular rate and rhythm, no murmurs rubs or gallops.  Abdomen: Bowel sounds are normal, nontender, nondistended, no hepatosplenomegaly or masses, no abdominal bruits or hernia , no rebound or guarding.   Extremities: No lower extremity edema. No clubbing or deformities. Neuro: Alert and oriented x 3.   Grossly intact. Skin: Warm and dry, no jaundice.   Psych: Alert and cooperative, normal mood and affect.   Imaging Studies: No results found.  Assessment and Plan:   James Haynes is a 36 y.o. y/o male here to follow-up for Crohn's disease of the small bowel diagnosed in 2017 and has only been on sulfasalazine and Entocort since then.  MR enterography demonstrates features in the ileum suggestive of small bowel Crohn's.  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   Evaluated by Dr. Annye English at [facility] to decide whether he needs any surgery and felt based on his symptoms at this point no urgent surgery was required and to give him the option of a trial of a biologic to see if we could get some improvement in his inflammation and obstruction. Commenced on Stelara in 06/2019, was doing well and present to the emergency room with worsening of abdominal pain and showed CT shows worsening  of his small bowel Crohn's disease.  I would plan to decrease the interval of his dosage from 90 mg every 12 weeks to every 8 weeks.  If that does not work we would need to change plans to probably [place].  He is also planning to see Dr. Dema Severin at [facility] as a follow-up.   1.   Decrease interval of Stelara to 90 mg every 8 weeks.  Continue steroids, will provide a prescription for 35 mg for 7 days and decreasing by 5 mg every week and I will gradually taper it off depending on his response.  Check CBC, CRP today   2.  No NSAID use.     3.  Stop vaping as tobacco use can increase the severity of Crohn's disease.   4.   Telephone follow-up in 2 weeks with labs for CBC, CRP, CMP     Dr Jonathon Bellows  MD,MRCP [facility]) Follow up in ***

## 2023-03-22 ENCOUNTER — Encounter: Payer: Self-pay | Admitting: Gastroenterology

## 2023-03-22 ENCOUNTER — Other Ambulatory Visit: Payer: Self-pay

## 2023-03-22 ENCOUNTER — Ambulatory Visit: Payer: Managed Care, Other (non HMO) | Admitting: Gastroenterology

## 2023-03-22 ENCOUNTER — Ambulatory Visit
Admission: RE | Admit: 2023-03-22 | Discharge: 2023-03-22 | Disposition: A | Discharge status: Home / Self Care | Payer: Managed Care, Other (non HMO) | Source: Ambulatory Visit | Attending: Physician Assistant | Admitting: Physician Assistant

## 2023-03-22 ENCOUNTER — Other Ambulatory Visit: Payer: Self-pay | Admitting: Physician Assistant

## 2023-03-22 VITALS — BP 145/101 | HR 88 | Temp 98.7°F | Ht 72.0 in | Wt 268.2 lb

## 2023-03-22 DIAGNOSIS — I10 Essential (primary) hypertension: Secondary | ICD-10-CM | POA: Diagnosis present

## 2023-03-22 DIAGNOSIS — K5000 Crohn's disease of small intestine without complications: Secondary | ICD-10-CM | POA: Diagnosis not present

## 2023-03-22 MED ORDER — NA SULFATE-K SULFATE-MG SULF 17.5-3.13-1.6 GM/177ML PO SOLN: 354.0000 mL | Freq: Once | ORAL | 0 refills | Status: AC

## 2023-03-22 NOTE — Addendum Note (Signed)
Addended by: Adela Ports on: 03/22/2023 03:25 PM   Modules accepted: Orders

## 2023-03-22 NOTE — Patient Instructions (Signed)
For your MRE exam, please [facility] on 03/30/2023 at 7 AM. Nothing to eat or drink 4 hours prior. If you are not able to make it to this date and time, please call central scheduling and reschedule by calling [phone].

## 2023-03-22 NOTE — Progress Notes (Signed)
Wyline Mood MD, MRCP(U.K) [address]  [address]  [place], [place] [address]  Main: [phone]  Fax: [phone]   Primary Care Physician: Patrice Paradise, MD  Primary Gastroenterologist:  Dr. Wyline Mood   Chief Complaint  Patient presents with   Crohn's Disease    HPI: James Haynes is a 37 y.o. male  Summary of history :   He was initially referred and seen in 02/26/2019 for chronic diarrhea and transferred his care from [place]  [facility] to myself.  He was previously been seen by Dr. Mechele Collin who has diagnosed him with inflammatory bowel disease: IBD-crohns disease of the small bowel in 2017 with ileitis and colitis of the ascending colon in 2017   . Commenced on Entocort in May 2017 for 2 months.  Subsequently in June 2017 Pentasa was added.  Unclear what happened subsequently but he has not followed up.      For over 4 years he had been having rectal bleeding diarrhea bloating abdominal distention and discomfort. .  No family history of Crohn's disease or ulcerative colitis.  He has been a tobacco smoker but recently changed to vaping.  Did not notice any change in his GI symptoms since then.  Denies any weight loss.  Denied any use of NSAIDs. 02/10/2019: C. difficile toxin negative: GI PCR negative: Fecal calprotectin elevated at 231: 02/09/2019: Hepatitis A antibody negative, T PMT normal enzyme activity.  TB QuantiFERON Negative.  Hepatitis B surface antibody negative nonreactive.  HBsAg, hepatitis B E antigen, hepatitis B E antibody negative, CRP 4.  12/21/2018 HIV negative   02/24/2019: MR enterography: Active inflammation in the terminal 25 cm ileum extending to the IC valve with wall thickening, mucosal enhancement.  Skip lesion seen in the ileum.  Air-fluid level in the rectum.  4 mm gallbladder polyp or stone.    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  It was probably 8 mm in diameter.  I could not see through the lumen.  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 Marin Olp at [facility] to evaluate whether he required surgery to treat the stricture of the small bowel.  He felt that since the patient was not overtly symptomatic, could try a trial of biologic agent to see if he gets any improvement.  This would hopefully postpone any surgery if not eventually prevent.    09/18/2019 CT scan of the abdomen and pelvis with contrast : extensive inflammatory wall thickening and mucosal hyperenhancement of the terminal ileum affecting approximately 40 cm of the most distal small bowel.  Inflammatory findings are increased in comparison to prior examinations and there is again seen chronic appearing fibrofatty mural stratification of the affected segments.  Findings are consistent with Crohn's disease without evidence of acute complication.  There are focally narrowed appearing segments of bowel without overt evidence of stricture or obstruction.  Small volume reactive ascites throughout the abdomen.    Interval history  09/25/2019-03/22/2023   Not seen for over 3 years at our practice.  When last seen he was on Stelara.  He was vaping on and off.  Last took Stelara few years back.  He says his symptoms never really resolved with Stelara.'s constant bloating abdominal distention nausea, diarrhea no blood in the stool denies any NSAID use.  Says he did not  return back to office due to issues with his work   02/05/2023: Hemoglobin 16.1 g HbA1c normal, B12 855 CMP normal    Current Outpatient Medications  Medication Sig Dispense Refill   amLODipine (NORVASC) 10 MG tablet Take 1 tablet by mouth daily.     cholecalciferol (VITAMIN D3) 25 MCG (1000 UNIT) tablet Take 1,000 Units by mouth daily.     cyanocobalamin (VITAMIN B12) 1000 MCG tablet Take 1,000 mcg by mouth daily.      hydrochlorothiazide (HYDRODIURIL) 25 MG tablet Take 1 tablet by mouth daily.     losartan (COZAAR) 100 MG tablet Take by mouth.     Omega-3 Fatty Acids (FISH OIL) 300 MG CAPS Take by mouth.     venlafaxine XR (EFFEXOR-XR) 75 MG 24 hr capsule TAKE 3 CAPSULES BY MOUTH EVERY DAY 270 capsule 0   No current facility-administered medications for this visit.    Allergies as of 03/22/2023   (No Known Allergies)     ROS:  General: Negative for anorexia, weight loss, fever, chills, fatigue, weakness. ENT: Negative for hoarseness, difficulty swallowing , nasal congestion. CV: Negative for chest pain, angina, palpitations, dyspnea on exertion, peripheral edema.  Respiratory: Negative for dyspnea at rest, dyspnea on exertion, cough, sputum, wheezing.  GI: See history of present illness. GU:  Negative for dysuria, hematuria, urinary incontinence, urinary frequency, nocturnal urination.  Endo: Negative for unusual weight change.    Physical Examination:   BP (!) 145/101   Pulse 88   Temp 98.7 F (37.1 C) (Oral)   Ht 6' (1.829 m)   Wt 268 lb 3.2 oz (121.7 kg)   BMI 36.37 kg/m   General: Well-nourished, well-developed in no acute distress.  Eyes: No icterus. Conjunctivae pink. Mouth: Oropharyngeal mucosa moist and pink , no lesions erythema or exudate. Lungs: Clear to auscultation bilaterally. Non-labored. Neuro: Alert and oriented x 3.  Grossly intact. Skin: Warm and dry, no jaundice.   Psych: Alert and cooperative, normal mood and affect.   Imaging Studies: US RENAL  Result Date: 03/22/2023 CLINICAL DATA:  Hypertension EXAM: RENAL / URINARY TRACT ULTRASOUND COMPLETE COMPARISON:  CT abdomen pelvis 09/18/2019 FINDINGS: Right Kidney: Renal measurements: 11.5 cm = volume: 179 mL. Echogenicity within normal limits. No mass or hydronephrosis visualized. Left Kidney: Renal measurements: 13.6 cm = volume: 249 mL. Echogenicity within normal limits. No mass or hydronephrosis visualized.  Bladder: Appears normal for degree of bladder distention. Other: Diffuse increased echogenicity of the visualized portions of the hepatic parenchyma are a nonspecific indicator of hepatocellular dysfunction, most commonly steatosis. IMPRESSION: No significant sonographic abnormality of the kidneys. Electronically Signed   By: Acquanetta Belling M.D.   On: 03/22/2023 13:53    Assessment and Plan:   James Haynes is a 37 y.o. y/o male here to follow-up for Crohn's disease of the small bowel diagnosed in 2017 and has only been on sulfasalazine and Entocort since then.  MR enterography demonstrates features in the ileum suggestive of small bowel Crohn's.  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   Evaluated by Dr. Angelena Form at [facility] to decide whether he needs any surgery and felt based on his symptoms at this point no urgent surgery was required and to give him the option of a trial of a biologic to see if we could get some improvement in his inflammation and obstruction. Commenced on Stelara in 06/2019, was on Stelara 90 mg every every 8 weeks.  He did not follow-up after 2021.  He says that the Stelara really did not help and he did take it, he was lost to follow-up for over 3 years not taking any medications for his Crohn's for over 3 years.   1.   Check labs including TB QuantiFERON, hepatitis B C serology. 2.  MR enterography to determine degree of inflammation 3.  Colonoscopy with terminal ileum intubation to determine the degree of inflammation seen following which we will discuss next steps may need to commence on either Skyrizi or similar IL 23 inhibitors   I have discussed alternative options, risks & benefits,  which include, but are not limited to, bleeding, infection, perforation,respiratory complication & drug reaction.  The patient agrees with this plan & written consent will be obtained.      Dr Wyline Mood  MD,MRCP [facility]) Follow  up in 8 weeks

## 2023-03-22 NOTE — Addendum Note (Signed)
Addended by: Adela Ports on: 03/22/2023 04:37 PM   Modules accepted: Orders

## 2023-03-23 ENCOUNTER — Telehealth: Payer: Self-pay

## 2023-03-23 NOTE — Telephone Encounter (Signed)
Patient insurance would not approved patient to have the two MRI at [facility] unless he had a medical reason on why he needed him to have them at a outpatient hospital setting. Got MRI approved through [facility]. He can have this done in the [place] location also. Please call and get patient change to [facility]

## 2023-03-23 NOTE — Telephone Encounter (Signed)
Called patient to notify him and he agreed. I also told him that I would be faxing his orders to [facility] and to be in the look out for their call to schedule his MRI. I told him that I would be send him a MyChart message with the information so he knew where to go. Patient understood.

## 2023-03-25 NOTE — Progress Notes (Signed)
Needs Hep A/B vaccine at next office visit

## 2023-03-30 ENCOUNTER — Ambulatory Visit: Payer: Managed Care, Other (non HMO)

## 2023-04-06 LAB — CBC WITH DIFFERENTIAL/PLATELET
Basophils Absolute: 0.1 10*3/uL (ref 0.0–0.2)
Basos: 1 %
EOS (ABSOLUTE): 0.1 10*3/uL (ref 0.0–0.4)
Eos: 1 %
Hematocrit: 43.7 % (ref 37.5–51.0)
Hemoglobin: 15.2 g/dL (ref 13.0–17.7)
Immature Grans (Abs): 0 10*3/uL (ref 0.0–0.1)
Immature Granulocytes: 0 %
Lymphocytes Absolute: 1.3 10*3/uL (ref 0.7–3.1)
Lymphs: 18 %
MCH: 31.5 pg (ref 26.6–33.0)
MCHC: 34.8 g/dL (ref 31.5–35.7)
MCV: 91 fL (ref 79–97)
Monocytes Absolute: 0.7 10*3/uL (ref 0.1–0.9)
Monocytes: 10 %
Neutrophils Absolute: 5 10*3/uL (ref 1.4–7.0)
Neutrophils: 70 %
Platelets: 396 10*3/uL (ref 150–450)
RBC: 4.83 x10E6/uL (ref 4.14–5.80)
RDW: 12.5 % (ref 11.6–15.4)
WBC: 7.1 10*3/uL (ref 3.4–10.8)

## 2023-04-06 LAB — QUANTIFERON-TB GOLD PLUS
QuantiFERON Mitogen Value: >10 [IU]/mL
QuantiFERON Nil Value: 0.01 [IU]/mL
QuantiFERON TB1 Ag Value: 0.01 [IU]/mL
QuantiFERON TB2 Ag Value: 0.01 [IU]/mL
QuantiFERON-TB Gold Plus: NEGATIVE

## 2023-04-06 LAB — HEPATITIS B E ANTIGEN: Hep B E Ag: NEGATIVE

## 2023-04-06 LAB — VITAMIN D 1,25 DIHYDROXY
Vitamin D 1, 25 (OH)2 Total: 54 pg/mL
Vitamin D2 1, 25 (OH)2: <10 pg/mL
Vitamin D3 1, 25 (OH)2: 54 pg/mL

## 2023-04-06 LAB — HEPATITIS C ANTIBODY: Hep C Virus Ab: NONREACTIVE

## 2023-04-06 LAB — HEPATITIS A ANTIBODY, TOTAL: hep A Total Ab: NEGATIVE

## 2023-04-06 LAB — HEPATITIS B SURFACE ANTIGEN: Hepatitis B Surface Ag: NEGATIVE

## 2023-04-06 LAB — HEPATITIS B SURFACE ANTIBODY,QUALITATIVE: Hep B Surface Ab, Qual: UNDETERMINED

## 2023-04-06 LAB — HEPATITIS B E ANTIBODY: Hep B E Ab: NONREACTIVE

## 2023-04-06 LAB — HEPATITIS B CORE ANTIBODY, TOTAL: Hep B Core Total Ab: NEGATIVE

## 2023-04-06 LAB — C-REACTIVE PROTEIN: CRP: 6 mg/L (ref 0–10)

## 2023-04-06 LAB — HIV ANTIBODY (ROUTINE TESTING W REFLEX): HIV Screen 4th Generation wRfx: NONREACTIVE

## 2023-04-07 NOTE — Telephone Encounter (Signed)
Patient still has not had MRI done can you please call [facility] in [place] at [phone] to get patient schedule

## 2023-04-07 NOTE — Telephone Encounter (Signed)
Called [facility] MRI department and let them know that I had faxed an order for patient to have his MRI scheduled and they stated that they would be contacting the patient to schedule it for him. I then reached out to the patient via MyChart letting him know to be in the look out for their phone call. I also let him know that if he didn't get a call today or by tomorrow morning, to please give them a call and ask to speak to the MRI department to schedule it.

## 2023-05-03 ENCOUNTER — Ambulatory Visit
Admission: RE | Admit: 2023-05-03 | Payer: Managed Care, Other (non HMO) | Source: Home / Self Care | Admitting: Gastroenterology

## 2023-05-03 SURGERY — COLONOSCOPY WITH PROPOFOL
Anesthesia: General

## 2023-05-11 ENCOUNTER — Other Ambulatory Visit: Payer: Managed Care, Other (non HMO)

## 2023-05-13 ENCOUNTER — Ambulatory Visit: Payer: Managed Care, Other (non HMO) | Admitting: Gastroenterology

## 2023-09-16 ENCOUNTER — Ambulatory Visit: Admitting: Urology

## 2023-09-16 VITALS — BP 154/104 | HR 79 | Ht 74.0 in | Wt 265.0 lb

## 2023-09-16 DIAGNOSIS — I10 Essential (primary) hypertension: Secondary | ICD-10-CM

## 2023-09-16 DIAGNOSIS — I861 Scrotal varices: Secondary | ICD-10-CM

## 2023-09-16 NOTE — Progress Notes (Signed)
 I, Maysun Jamey Mccallum, acting as a scribe for Geraline Knapp, MD., have documented all relevant documentation on the behalf of Geraline Knapp, MD, as directed by Geraline Knapp, MD while in the presence of Geraline Knapp, MD.  09/16/2023 2:53 PM   Artis Lat [birth year] [id number]  Referring provider: Delmus Ferri, MD [address] [place]  [place] [address]  Chief Complaint  Patient presents with   Testicle Pain    HPI: Nimrod Wendt is a 38 y.o. male referred for evaluation of a varicocele.   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 Complained of dull pain rated 5/10. No bothersome LUTS.  A prior evaluation for hypogonadism. A scrotal ultrasound was performed 08/11/2023 which showed a moderate right varicocele. Testes were normal appearing bilaterally.   PMH: Past Medical History:  Diagnosis Date   Anxiety    Depression    HTN (hypertension)    Low testosterone     Surgical History: Past Surgical History:  Procedure Laterality Date   COLONOSCOPY WITH PROPOFOL N/A 03/17/2019   Procedure: COLONOSCOPY WITH PROPOFOL;  Surgeon: Luke Salaam, MD;  [facility] ENDOSCOPY;  Service: Gastroenterology;  Laterality: N/A;   FOOT SURGERY  2012    Home Medications:  Allergies as of 09/16/2023   No Known Allergies      Medication List        Accurate as of September 16, 2023  2:53 PM. If you have any questions, ask your nurse or doctor.          amLODipine 10 MG tablet Commonly known as: NORVASC Take 1 tablet by mouth daily.   cholecalciferol 25 MCG (1000 UNIT) tablet Commonly known as: VITAMIN D3 Take 1,000 Units by mouth daily.   cyanocobalamin 1000 MCG tablet Commonly known as: VITAMIN B12 Take 1,000 mcg by mouth daily.   Fish Oil 300 MG Caps Take by mouth.   hydrochlorothiazide 25 MG tablet Commonly known as: HYDRODIURIL Take 1 tablet by mouth daily.    losartan 100 MG tablet Commonly known as: COZAAR Take by mouth.   metoprolol succinate 50 MG 24 hr tablet Commonly known as: TOPROL-XL Take 1 tablet by mouth daily.   venlafaxine XR 75 MG 24 hr capsule Commonly known as: EFFEXOR-XR TAKE 3 CAPSULES BY MOUTH EVERY DAY        Allergies: No Known Allergies  Family History: Family History  Problem Relation Age of Onset   Prostate cancer Paternal Grandfather    Kidney cancer Neg Hx    Bladder Cancer Neg Hx     Social History:  reports that he has quit smoking. His smoking use included cigarettes. He has quit using smokeless tobacco.  His smokeless tobacco use included chew. He reports current alcohol use of about 2.0 standard drinks of alcohol per week. He reports that he does not use drugs.   Physical Exam: BP (!) 154/104   Pulse 79   Ht 6\' 2"  (1.88 m)   Wt 265 lb (120.2 kg)   BMI 34.02 kg/m   Constitutional:  Alert and oriented, No acute distress. HEENT: [place] AT Respiratory: Normal respiratory effort, no increased work of breathing. GI: Abdomen is soft, nontender, nondistended, no abdominal masses GU: Phallus without lesion. Testes descended bilaterally without massive retinitis. Large right varicocele visible on standing, extending into the inguinal canal.  Psychiatric: Normal mood and affect.   Assessment & Plan:  1. Right varicocele We discussed 90% of varicoceles are on the left side and the possibility of a retroperitoneal mass causing venous compression and a right-sided varicocele was discussed.  He had a renal ultrasound 03/22/2023 which did not show any abnormalities. Recommend CT abdomen pelvis with contrast to rule out the possibility of a retroperitoneal mass.  [facility] [address], [address] [place], [place] [address] [phone]

## 2023-09-18 ENCOUNTER — Encounter: Payer: Self-pay | Admitting: Urology

## 2023-09-28 ENCOUNTER — Ambulatory Visit

## 2023-09-30 ENCOUNTER — Ambulatory Visit: Admission: RE | Admit: 2023-09-30 | Source: Ambulatory Visit

## 2024-06-15 ENCOUNTER — Other Ambulatory Visit: Payer: Self-pay | Admitting: Gastroenterology

## 2024-06-15 DIAGNOSIS — K5000 Crohn's disease of small intestine without complications: Secondary | ICD-10-CM

## 2024-06-18 ENCOUNTER — Ambulatory Visit: Admission: RE | Admit: 2024-06-18 | Discharge: 2024-06-18 | Attending: Gastroenterology

## 2024-06-18 ENCOUNTER — Ambulatory Visit
Admission: RE | Admit: 2024-06-18 | Discharge: 2024-06-18 | Disposition: A | Source: Ambulatory Visit | Attending: Gastroenterology

## 2024-06-18 DIAGNOSIS — K5000 Crohn's disease of small intestine without complications: Secondary | ICD-10-CM | POA: Insufficient documentation

## 2024-06-18 MED ORDER — GADOBUTROL 1 MMOL/ML IV SOLN
10.0000 mL | Freq: Once | INTRAVENOUS | Status: AC | PRN
  Administered 2024-06-18: 10 mL via INTRAVENOUS

## 2024-06-19 ENCOUNTER — Ambulatory Visit: Payer: Self-pay | Admitting: Gastroenterology
